# Patient Record
Sex: Female | Born: 1945 | Hispanic: No | Smoking: Former smoker
Health system: Southern US, Community
[De-identification: ages and names within clinical notes are randomized; demographics above are authoritative.]

## PROBLEM LIST (undated history)

## (undated) DIAGNOSIS — K2270 Barrett's esophagus without dysplasia: Secondary | ICD-10-CM

## (undated) DIAGNOSIS — Z973 Presence of spectacles and contact lenses: Secondary | ICD-10-CM

## (undated) DIAGNOSIS — M797 Fibromyalgia: Secondary | ICD-10-CM

## (undated) DIAGNOSIS — Z8489 Family history of other specified conditions: Secondary | ICD-10-CM

## (undated) DIAGNOSIS — E119 Type 2 diabetes mellitus without complications: Secondary | ICD-10-CM

## (undated) DIAGNOSIS — R160 Hepatomegaly, not elsewhere classified: Secondary | ICD-10-CM

## (undated) DIAGNOSIS — I10 Essential (primary) hypertension: Secondary | ICD-10-CM

## (undated) DIAGNOSIS — R42 Dizziness and giddiness: Secondary | ICD-10-CM

## (undated) DIAGNOSIS — K589 Irritable bowel syndrome without diarrhea: Secondary | ICD-10-CM

## (undated) DIAGNOSIS — E7800 Pure hypercholesterolemia, unspecified: Secondary | ICD-10-CM

## (undated) DIAGNOSIS — K219 Gastro-esophageal reflux disease without esophagitis: Secondary | ICD-10-CM

## (undated) HISTORY — DX: Dizziness and giddiness: R42

## (undated) HISTORY — DX: Type 2 diabetes mellitus without complications: E11.9

## (undated) HISTORY — DX: Pure hypercholesterolemia, unspecified: E78.00

## (undated) HISTORY — DX: Fibromyalgia: M79.7

## (undated) HISTORY — DX: Essential (primary) hypertension: I10

## (undated) HISTORY — DX: Irritable bowel syndrome, unspecified: K58.9

---

## 2002-07-08 ENCOUNTER — Emergency Department (HOSPITAL_COMMUNITY): Admission: EM | Admit: 2002-07-08 | Discharge: 2002-07-08 | Payer: Self-pay | Admitting: Emergency Medicine

## 2002-07-08 ENCOUNTER — Encounter: Payer: Self-pay | Admitting: *Deleted

## 2002-07-09 ENCOUNTER — Encounter: Payer: Self-pay | Admitting: *Deleted

## 2002-07-28 ENCOUNTER — Encounter: Admission: RE | Admit: 2002-07-28 | Discharge: 2002-07-28 | Payer: Self-pay | Admitting: Family Medicine

## 2002-08-03 ENCOUNTER — Ambulatory Visit (HOSPITAL_COMMUNITY): Admission: RE | Admit: 2002-08-03 | Discharge: 2002-08-03 | Payer: Self-pay | Admitting: Obstetrics and Gynecology

## 2002-09-08 ENCOUNTER — Encounter: Admission: RE | Admit: 2002-09-08 | Discharge: 2002-09-08 | Payer: Self-pay | Admitting: Family Medicine

## 2003-11-19 ENCOUNTER — Emergency Department (HOSPITAL_COMMUNITY): Admission: EM | Admit: 2003-11-19 | Discharge: 2003-11-19 | Payer: Self-pay | Admitting: *Deleted

## 2011-01-29 DIAGNOSIS — E785 Hyperlipidemia, unspecified: Secondary | ICD-10-CM | POA: Insufficient documentation

## 2011-01-29 DIAGNOSIS — E1159 Type 2 diabetes mellitus with other circulatory complications: Secondary | ICD-10-CM | POA: Insufficient documentation

## 2011-01-29 DIAGNOSIS — E119 Type 2 diabetes mellitus without complications: Secondary | ICD-10-CM | POA: Insufficient documentation

## 2011-01-29 DIAGNOSIS — E1169 Type 2 diabetes mellitus with other specified complication: Secondary | ICD-10-CM | POA: Insufficient documentation

## 2011-03-17 HISTORY — PX: TOTAL VAGINAL HYSTERECTOMY: SHX2548

## 2011-05-31 DIAGNOSIS — M6208 Separation of muscle (nontraumatic), other site: Secondary | ICD-10-CM | POA: Insufficient documentation

## 2011-05-31 DIAGNOSIS — Z90710 Acquired absence of both cervix and uterus: Secondary | ICD-10-CM | POA: Insufficient documentation

## 2012-04-01 DIAGNOSIS — K219 Gastro-esophageal reflux disease without esophagitis: Secondary | ICD-10-CM | POA: Insufficient documentation

## 2012-10-07 DIAGNOSIS — E739 Lactose intolerance, unspecified: Secondary | ICD-10-CM | POA: Insufficient documentation

## 2012-12-03 DIAGNOSIS — B9689 Other specified bacterial agents as the cause of diseases classified elsewhere: Secondary | ICD-10-CM | POA: Insufficient documentation

## 2017-06-04 DIAGNOSIS — K580 Irritable bowel syndrome with diarrhea: Secondary | ICD-10-CM | POA: Insufficient documentation

## 2017-06-10 ENCOUNTER — Other Ambulatory Visit: Payer: Self-pay | Admitting: Internal Medicine

## 2017-06-10 DIAGNOSIS — Z1231 Encounter for screening mammogram for malignant neoplasm of breast: Secondary | ICD-10-CM

## 2017-07-03 NOTE — Progress Notes (Deleted)
[place] at [facility] [address], [place], [place] [address] [phone]  Date:  07/08/2017   Name:  Sue Ruiz   DOB:  07/08/1945   MRN:  [id number]  PCP:  No primary care provider on file.    Chief Complaint: No chief complaint on file.   History of Present Illness:  REDITH DRACH is a 72 y.o. very pleasant female patient who presents with the following:  Here today as a new patient- I take care of her husband Lynann Bologna  There are no active problems to display for this patient.   No past medical history on file.  *** The histories are not reviewed yet. Please review them in the "History" navigator section and refresh this [place].  Social History   Tobacco Use  . Smoking status: Not on file  Substance Use Topics  . Alcohol use: Not on file  . Drug use: Not on file    No family history on file.  Allergies not on file  Medication list has been reviewed and updated.  No current outpatient medications on file prior to visit.   No current facility-administered medications on file prior to visit.     Review of Systems:  As per HPI- otherwise negative.   Physical Examination: There were no vitals filed for this visit. There were no vitals filed for this visit. There is no height or weight on file to calculate BMI. Ideal Body Weight:    GEN: WDWN, NAD, Non-toxic, A & O x 3 HEENT: Atraumatic, Normocephalic. Neck supple. No masses, No LAD. Ears and Nose: No external deformity. CV: RRR, No M/G/R. No JVD. No thrill. No extra heart sounds. PULM: CTA B, no wheezes, crackles, rhonchi. No retractions. No resp. distress. No accessory muscle use. ABD: S, NT, ND, +BS. No rebound. No HSM. EXTR: No c/c/e NEURO Normal gait.  PSYCH: Normally interactive. Conversant. Not depressed or anxious appearing.  Calm demeanor.    Assessment and Plan: ***  Signed Lamar Blinks, MD

## 2017-07-08 ENCOUNTER — Ambulatory Visit: Payer: Medicaid Other | Admitting: Family Medicine

## 2017-10-14 ENCOUNTER — Ambulatory Visit: Payer: Self-pay | Admitting: Family Medicine

## 2017-11-18 ENCOUNTER — Other Ambulatory Visit: Payer: Self-pay | Admitting: Surgery

## 2017-11-18 DIAGNOSIS — R109 Unspecified abdominal pain: Secondary | ICD-10-CM

## 2017-11-30 ENCOUNTER — Other Ambulatory Visit: Payer: Medicaid Other

## 2017-12-07 ENCOUNTER — Ambulatory Visit
Admission: RE | Admit: 2017-12-07 | Discharge: 2017-12-07 | Disposition: A | Discharge status: Home / Self Care | Payer: Medicare Other | Source: Ambulatory Visit | Attending: Surgery | Admitting: Surgery

## 2017-12-07 DIAGNOSIS — R109 Unspecified abdominal pain: Secondary | ICD-10-CM

## 2017-12-07 MED ORDER — IOPAMIDOL (ISOVUE-300) INJECTION 61%
100.0000 mL | Freq: Once | INTRAVENOUS | Status: AC | PRN
  Administered 2017-12-07: 100 mL via INTRAVENOUS

## 2017-12-14 ENCOUNTER — Telehealth: Payer: Self-pay

## 2017-12-14 NOTE — Telephone Encounter (Signed)
Incoming call from pt - she would like to schedule her appt on Thursday to a little later due to stomach issues in the mornings.  Appt changed to 1:30 pm. Arrival 1 pm on Oct 3rd- voiced understanding and directions given also.

## 2017-12-16 ENCOUNTER — Telehealth: Payer: Self-pay

## 2017-12-16 ENCOUNTER — Ambulatory Visit: Payer: Medicare Other | Admitting: Gynecologic Oncology

## 2017-12-16 NOTE — Telephone Encounter (Signed)
Pt called to reschedule due to transportation issues- rescheduled to Monday Oct 7th at 2 pm- arrival 1:30 pm - directions given- Joylene John NP aware of reschedule. No other needs per pt at this time.

## 2017-12-16 NOTE — Progress Notes (Signed)
[place] at [facility] Note: New Patient FIRST VISIT   Consult was requested by Dr. Lucillie Garfinkel for a pelvic mass (h/o TVH/BSO)   Chief Complaint  Patient presents with  . Ovarian mass    GYN Oncologic Summary 1. TBD o .  HPI: Ms. JALAILA CARADONNA  is a very nice 72 y.o.  P4  She noted some right pelvic pain. Sometimes this radiates to the left. She reported this to a provider in [place] who imaged her and told her she had a mass where her left ovary would normally sit.  The issue is she had both her ovaries removed when she had her vaginal hysterectomy in 2013. The pathology and operative reports confirm removal of the bilateral ovaries. There is notation of a fundal fibroid but that, too, appears to have been removed with the hysterectomy. The ovaries seem to be complete on the pathology description.   On 11/03/17 she saw Dr. Royston Sinner with Ob/Gyn and a pelvic US confirmed the presence of a solid left adnexal mass (5.2 x 3.7 x 5.0cm). Given there should be no Gyn organs remaining she was referred to General Surgery (also for hernia) and to [place] for further recommendations/management.   General Surgery (Dr. Brantley Stage) feels she has diastasis recti not requiring surgical repair and ordered a CTAP. The scan confirmed a LEFT adnexal mass 5.0 x 4.2 x 4.1 cm.  Last colonoscopy >10 years ago. Has lower pelvic pain worse when she bends over. Occurs ~2x / week and worsened with BM. She gets nausea, but no emesis. No changes in weight, but has IBS so difficult to interpret GI changes.  Imported EPIC Oncologic History:   No history exists.    Measurement of disease: TBD . Marland Kitchen  Radiology: Ct Abdomen Pelvis W Contrast  Result Date: 12/07/2017 CLINICAL DATA:  Abdominal pain and bloating for 8 months, history irritable bowel syndrome, hypertension, diabetes mellitus, smoker EXAM: CT ABDOMEN AND PELVIS WITH CONTRAST TECHNIQUE: Multidetector CT imaging of  the abdomen and pelvis was performed using the standard protocol following bolus administration of intravenous contrast. Sagittal and coronal MPR images reconstructed from axial data set. Creatinine was obtained on site at [place] at [address] Results: Creatinine 0.6 mg/dL. CONTRAST:  151mL ISOVUE-300 IOPAMIDOL (ISOVUE-300) INJECTION 61% IV. Dilute oral contrast. COMPARISON:  None FINDINGS: Lower chest: Lung bases clear.  Minimal pericardial fluid. Hepatobiliary: Gallbladder and liver normal appearance Pancreas: Normal appearance Spleen: Normal appearance.  Tiny splenule near splenic hilum Adrenals/Urinary Tract: Adrenal glands kidneys, and ureters normal appearance. Low descent of urinary bladder in pelvis consistent with cystocele. Stomach/Bowel: Normal appendix. Small hiatal hernia. Stomach and bowel loops otherwise normal appearance. Vascular/Lymphatic: Atherosclerotic calcifications aorta and iliac arteries without aneurysm. No adenopathy Reproductive: Uterus surgically absent. Nonvisualization of RIGHT ovary. LEFT adnexal mass 5.0 x 4.2 x 4.1 cm, question mass/neoplasm. Other: No free air or free fluid. No hernia or acute inflammatory process. Musculoskeletal: Osseous demineralization. Degenerative disc disease changes L5-S1. IMPRESSION: LEFT ovarian mass 5.0 cm diameter concerning for ovarian neoplasm; characterization by pelvic and transvaginal ultrasound recommended for further assessment. Cystocele. Minimal pericardial effusion. These results will be called to the ordering clinician or representative by the Radiologist Assistant, and communication documented in the PACS or zVision Dashboard. Electronically Signed   By: Lavonia Dana M.D.   On: 12/07/2017 19:15   .   Outpatient Encounter Medications as of 12/20/2017  Medication Sig  . Blood Glucose  Monitoring Suppl (GLUCOCOM BLOOD GLUCOSE MONITOR) DEVI 1 Units by Misc.(Non-Drug; Combo Route) route 4 times daily.  Marland Kitchen esomeprazole  (NEXIUM) 40 MG capsule 2 (two) times daily before a meal.   . glimepiride (AMARYL) 4 MG tablet TAKE 1 TABLET BY MOUTH DAILY  . glucose blood (RELION PRIME TEST) test strip Check blood sugar once in the morning before eating; two hours after dinner; and as needed for low blood sugar symptoms.  . hydrochlorothiazide (HYDRODIURIL) 25 MG tablet TAKE 1 TABLET BY MOUTH DAILY  . metFORMIN (GLUCOPHAGE) 1000 MG tablet TAKE 1 TABLET BY MOUTH TWICE DAILY  . pioglitazone (ACTOS) 15 MG tablet Take by mouth daily.   . pravastatin (PRAVACHOL) 40 MG tablet TAKE 1 TABLET BY MOUTH DAILY  . triamcinolone ointment (KENALOG) 0.1 % APP THIN LAYER EXT AA BID   No facility-administered encounter medications on file as of 12/20/2017.    Allergies  Allergen Reactions  . Codeine Swelling    Tongue gets swollen and a rash  . Asa [Aspirin] Rash    Past Medical History:  Diagnosis Date  . Diabetes mellitus without complication ([place])   . Fibromyalgia   . High cholesterol   . Hypertension   . IBS (irritable bowel syndrome)   . Vertigo    Past Surgical History:  Procedure Laterality Date  . VAGINAL HYSTERECTOMY          Past Gynecological History:   GYNECOLOGIC HISTORY:  . No LMP recorded. age 50 . Menarche: 72 years old . P 4 . Contraceptive none . HRT none  . Last Pap NA Family Hx:  Family History  Problem Relation Age of Onset  . Diabetes Mother   . Hypertension Mother   . Diabetes Father   . Hypertension Father   . Diabetes Brother   . Hypertension Brother    Social Hx:  Marland Kitchen Tobacco use: quit 20 years ago . Alcohol use: prior abuse; none for 20 years . Illicit Drug use: none . Illicit IV Drug use: none    Review of Systems: Review of Systems  Constitutional: Positive for appetite change.  HENT:   Positive for tinnitus.   Gastrointestinal: Positive for abdominal distention, abdominal pain, diarrhea and nausea.  Musculoskeletal: Positive for back pain.  Skin: Positive for itching.  All  other systems reviewed and are negative.   Vitals:  Vitals:   12/20/17 1418  Weight: 143 lb 8 oz (65.1 kg)  Height: 5' (1.524 m)    Vitals:   12/20/17 1418  BP: 122/74  Pulse: 89  Resp: 18  Temp: 98.9 F (37.2 C)  SpO2: 98%   Body mass index is 28.03 kg/m.  Physical Exam: General :  Well developed, 72 y.o., female in no apparent distress HEENT:  Normocephalic/atraumatic, symmetric, EOMI, eyelids normal Neck:   Supple, no masses.  Lymphatics:  No cervical/ submandibular/ supraclavicular/ infraclavicular/ inguinal adenopathy Respiratory:  Respirations unlabored, no use of accessory muscles CV:   Deferred Breast:  Deferred Musculoskeletal: No CVA tenderness, normal muscle strength. Abdomen:  +diastasis recti. Soft, non-tender and nondistended. No masses. Extremities:  No lymphedema, no erythema, non-tender. Skin:   Normal inspection Neuro/Psych:  No focal motor deficit, no abnormal mental status. Normal gait. Normal affect. Alert and oriented to person, place, and time  Genito Urinary: Vulva: Normal external female genitalia.  Bladder/urethra: Urethral meatus normal in size and location. No lesions or   masses, well supported bladder Speculum exam: Vagina: No lesion, no discharge, no bleeding. Cervix: surgically absent Bimanual  exam: Cervix/Uterus/Adnexa: Surgically absent  Adnexa: No masses. Rectovaginal:  Good tone, no masses, no cul de sac nodularity, no parametrial involvement or nodularity. Despite imaging I am unable to detect a pelvic mass.   Assessment  Pelvic mass (left) with h/o TVH/BSO ECOG PERFORMANCE STATUS: 1 - Symptomatic but completely ambulatory  Plan  1. Data reviewed ? I independently reviewed the images and the radiology reports and discussed my interpretation in the presence of the patient today ? The mass is pelvic in location, it appears c/w a Gyn source but it is unusual in the sense that she already has had her uterus and ovary removed. Defer  to surgery if they can think of other etiology (?node/mesenteric lesion)? ? Possible ovarian remnant? ? I reviewed her referring doctor's office notes and I have summarized in the HPI ? History was obtained from the patient and the chart ? We reviewed no tumor marker has been recorded that I can see ? I will order CEA and CA125 2. Etiology ? Could be ovarian remnant versus Non-GYN origin ? Fibroid possible - she did have fibroid at time of surgery and I do not know what preop imaging showed.  ? Agree with Dr. Royston Sinner in [place] could be parasitic fibroid although uncommon. ? We will order an MRI to better assess the mass and potentially the origin 3. Surgical discussion ? The mass is intimate on a couple of the images with the bladder; again, we'll see what the MRI finds. ? She may need Dr. Brantley Stage and myself to be involved in surgical planning. 4. I will have her return after her MRI  Isabel Caprice, MD  12/20/2017, 5:02 PM    Cc: Lucillie Garfinkel, MD (Referring Ob/Gyn) Jenel Lucks, PA-C (PCP) Erroll Luna, MD (General Surgery)

## 2017-12-20 ENCOUNTER — Inpatient Hospital Stay: Payer: Medicare Other

## 2017-12-20 ENCOUNTER — Inpatient Hospital Stay: Payer: Medicare Other | Attending: Obstetrics | Admitting: Obstetrics

## 2017-12-20 ENCOUNTER — Encounter: Payer: Self-pay | Admitting: Obstetrics

## 2017-12-20 VITALS — BP 122/74 | HR 89 | Temp 98.9°F | Resp 18 | Ht 60.0 in | Wt 143.5 lb

## 2017-12-20 DIAGNOSIS — D398 Neoplasm of uncertain behavior of other specified female genital organs: Secondary | ICD-10-CM | POA: Insufficient documentation

## 2017-12-20 DIAGNOSIS — N949 Unspecified condition associated with female genital organs and menstrual cycle: Secondary | ICD-10-CM

## 2017-12-20 DIAGNOSIS — R978 Other abnormal tumor markers: Secondary | ICD-10-CM

## 2017-12-20 DIAGNOSIS — Z90722 Acquired absence of ovaries, bilateral: Secondary | ICD-10-CM | POA: Diagnosis not present

## 2017-12-20 DIAGNOSIS — D3910 Neoplasm of uncertain behavior of unspecified ovary: Secondary | ICD-10-CM | POA: Diagnosis not present

## 2017-12-20 DIAGNOSIS — Z90710 Acquired absence of both cervix and uterus: Secondary | ICD-10-CM | POA: Diagnosis not present

## 2017-12-20 DIAGNOSIS — R1900 Intra-abdominal and pelvic swelling, mass and lump, unspecified site: Secondary | ICD-10-CM

## 2017-12-20 DIAGNOSIS — N838 Other noninflammatory disorders of ovary, fallopian tube and broad ligament: Secondary | ICD-10-CM

## 2017-12-20 NOTE — Patient Instructions (Signed)
1. We will order an MRI to better assess the mass  2. Return after your MRI 3. We will check some bloodwork today and discuss those results when you return. 4. I would prefer you to have a colonoscopy if possible.

## 2017-12-21 LAB — CEA (IN HOUSE-CHCC): CEA (CHCC-In House): 1.18 ng/mL (ref 0.00–5.00)

## 2017-12-21 LAB — CA 125: Cancer Antigen (CA) 125: 4 U/mL (ref 0.0–38.1)

## 2017-12-23 NOTE — Progress Notes (Signed)
[place] at [facility] Note: New Patient FIRST VISIT   Consult was requested by Dr. Lucillie Garfinkel for a pelvic mass (h/o TVH/BSO)   Chief Complaint  Patient presents with  . Ovarian mass    GYN Oncologic Summary 1. TBD o .  HPI: Ms. Sue Ruiz  is a very nice 72 y.o.  P4  She noted some right pelvic pain. Sometimes this radiates to the left. She reported this to a provider in [place] who imaged her early 2019 and told her she had a mass where her left ovary would normally sit.  The issue is she had both her ovaries removed when she had her vaginal hysterectomy in 2013. The pathology and operative reports confirm removal of the bilateral ovaries. There is notation of a fundal fibroid but that, too, appears to have been removed with the hysterectomy. The ovaries seem to be complete on the pathology description.   On 11/03/17 she saw Dr. Royston Sinner with Ob/Gyn and a pelvic US confirmed the presence of a solid left adnexal mass (5.2 x 3.7 x 5.0cm). Given there should be no Gyn organs remaining she was referred to General Surgery (also for hernia) and then to [place] for further recommendations/management.   General Surgery (Dr. Brantley Stage) feels she has diastasis recti not requiring surgical repair and ordered a CTAP. The scan confirmed a LEFT adnexal mass 5.0 x 4.2 x 4.1 cm.  Last colonoscopy >10 years ago. Has lower pelvic pain worse when she bends over. Occurs ~2x / week and worsened with BM. She gets nausea, but no emesis. No changes in weight, but has IBS so difficult to interpret GI changes.  Since her last visit I ordered an MRI as noted below. In addition tumor markers, which were normal. MRI: 5 cm left adnexal mass, with nonspecific characteristics. Differential diagnosis includes a broad ligament fibroid, leiomyosarcoma, and other neurogenic or mesenchymal tumors. Surgical evaluation should be considered. No evidence of pelvic metastatic disease  or other acute findings.  Recall the ultrasound with Dr. Royston Sinner revealed no doppler flow in the mass.  January 2019 CTAP was done in [place], not available, but an ultrasound in followup showed the mass was stable. ~5x4cm.  She states today she is having no issues.  Imported EPIC Oncologic History:   No history exists.    Measurement of disease: Recent Labs    12/20/17 1521  CAN125 4.0  . CEA = 1.18  Radiology: Mr Pelvis W Wo Contrast  Result Date: 12/27/2017 CLINICAL DATA:  Abdominal pain and bloating for several months. Pelvic mass on recent CT. Previous TAH-BSO. Creatinine was obtained on site at [facility] at [address] Results: Creatinine 0.7 mg/dL. EXAM: MRI PELVIS WITHOUT AND WITH CONTRAST TECHNIQUE: Multiplanar multisequence MR imaging of the pelvis was performed both before and after administration of intravenous contrast. CONTRAST:  26mL MULTIHANCE GADOBENATE DIMEGLUMINE 529 MG/ML IV SOLN COMPARISON:  CT on 12/07/2017 FINDINGS: Urinary Tract: No urinary bladder or urethral abnormality. Bowel: Unremarkable pelvic bowel loops. Vascular/Lymphatic: Unremarkable. No pathologically enlarged pelvic lymph nodes identified. Reproductive: -- Uterus: Surgically absent. Vaginal cuff is unremarkable in appearance. -- Right ovary: History of surgical removal. No right adnexal mass identified. -- Left ovary: History of surgical removal. A well-circumscribed mass is seen in the left adnexa which shows heterogeneous pattern of T2 hypointensity and hyperintensity, and postcontrast enhancement. This mass measures 5.0 x 4.0 cm on image 18/30. There is no evidence of invasion  of adjacent structures. This has nonspecific characteristics, and differential diagnosis includes a broad ligament fibroid, and neurogenic or other mesenchymal tumors. Other: No peritoneal thickening or abnormal free fluid. Musculoskeletal:  Unremarkable. IMPRESSION: 5 cm left adnexal mass, with nonspecific  characteristics. Differential diagnosis includes a broad ligament fibroid, leiomyosarcoma, and other neurogenic or mesenchymal tumors. Surgical evaluation should be considered. No evidence of pelvic metastatic disease or other acute findings. Prior hysterectomy. Electronically Signed   By: Earle Gell M.D.   On: 12/27/2017 11:13   Ct Abdomen Pelvis W Contrast  Result Date: 12/07/2017 CLINICAL DATA:  Abdominal pain and bloating for 8 months, history irritable bowel syndrome, hypertension, diabetes mellitus, smoker EXAM: CT ABDOMEN AND PELVIS WITH CONTRAST TECHNIQUE: Multidetector CT imaging of the abdomen and pelvis was performed using the standard protocol following bolus administration of intravenous contrast. Sagittal and coronal MPR images reconstructed from axial data set. Creatinine was obtained on site at [place] at [address] Results: Creatinine 0.6 mg/dL. CONTRAST:  146mL ISOVUE-300 IOPAMIDOL (ISOVUE-300) INJECTION 61% IV. Dilute oral contrast. COMPARISON:  None FINDINGS: Lower chest: Lung bases clear.  Minimal pericardial fluid. Hepatobiliary: Gallbladder and liver normal appearance Pancreas: Normal appearance Spleen: Normal appearance.  Tiny splenule near splenic hilum Adrenals/Urinary Tract: Adrenal glands kidneys, and ureters normal appearance. Low descent of urinary bladder in pelvis consistent with cystocele. Stomach/Bowel: Normal appendix. Small hiatal hernia. Stomach and bowel loops otherwise normal appearance. Vascular/Lymphatic: Atherosclerotic calcifications aorta and iliac arteries without aneurysm. No adenopathy Reproductive: Uterus surgically absent. Nonvisualization of RIGHT ovary. LEFT adnexal mass 5.0 x 4.2 x 4.1 cm, question mass/neoplasm. Other: No free air or free fluid. No hernia or acute inflammatory process. Musculoskeletal: Osseous demineralization. Degenerative disc disease changes L5-S1. IMPRESSION: LEFT ovarian mass 5.0 cm diameter concerning for ovarian  neoplasm; characterization by pelvic and transvaginal ultrasound recommended for further assessment. Cystocele. Minimal pericardial effusion. These results will be called to the ordering clinician or representative by the Radiologist Assistant, and communication documented in the PACS or zVision Dashboard. Electronically Signed   By: Lavonia Dana M.D.   On: 12/07/2017 19:15   .   Outpatient Encounter Medications as of 12/28/2017  Medication Sig  . acetaminophen (TYLENOL) 325 MG tablet Take 650 mg by mouth as needed.  . Blood Glucose Monitoring Suppl (GLUCOCOM BLOOD GLUCOSE MONITOR) DEVI 1 Units by Misc.(Non-Drug; Combo Route) route 4 times daily.  Marland Kitchen esomeprazole (NEXIUM) 40 MG capsule 2 (two) times daily before a meal.   . glimepiride (AMARYL) 4 MG tablet TAKE 1 TABLET BY MOUTH DAILY  . glucose blood (RELION PRIME TEST) test strip Check blood sugar once in the morning before eating; two hours after dinner; and as needed for low blood sugar symptoms.  . hydrochlorothiazide (HYDRODIURIL) 25 MG tablet TAKE 1 TABLET BY MOUTH DAILY  . metFORMIN (GLUCOPHAGE) 1000 MG tablet TAKE 1 TABLET BY MOUTH TWICE DAILY  . pioglitazone (ACTOS) 15 MG tablet Take by mouth daily.   . pravastatin (PRAVACHOL) 40 MG tablet TAKE 1 TABLET BY MOUTH DAILY  . triamcinolone ointment (KENALOG) 0.1 % APP THIN LAYER EXT AA BID   No facility-administered encounter medications on file as of 12/28/2017.    Allergies  Allergen Reactions  . Codeine Swelling    Tongue gets swollen and a rash  . Asa [Aspirin] Rash    Past Medical History:  Diagnosis Date  . Diabetes mellitus without complication ([place])   . Fibromyalgia   . High cholesterol   . Hypertension   .  IBS (irritable bowel syndrome)   . Vertigo    Past Surgical History:  Procedure Laterality Date  . TOTAL VAGINAL HYSTERECTOMY  2013   with BSO - confirmed by op report and pathology. Done for prolapse        Past Gynecological History:   GYNECOLOGIC HISTORY:   . No LMP recorded. age 88 . Menarche: 72 years old . P 4 . Contraceptive none . HRT none  . Last Pap NA Family Hx:  Family History  Problem Relation Age of Onset  . Diabetes Mother   . Hypertension Mother   . Diabetes Father   . Hypertension Father   . Diabetes Brother   . Hypertension Brother    Social Hx:  Marland Kitchen Tobacco use: quit 20 years ago . Alcohol use: prior abuse; none for 20 years . Illicit Drug use: none . Illicit IV Drug use: none    Review of Systems: Review of Systems  HENT:   Positive for tinnitus.   Gastrointestinal: Positive for abdominal pain and diarrhea.  Musculoskeletal: Positive for back pain.  All other systems reviewed and are negative. Notes abdominal pain is RUQ  Vitals:  Vitals:   12/28/17 1324  Weight: 144 lb 6.4 oz (65.5 kg)  Height: 5\' 1"  (1.549 m)    Vitals:   12/28/17 1324  BP: (!) 120/57  Pulse: 93  Resp: 20  Temp: 99 F (37.2 C)  SpO2: 98%   Body mass index is 27.28 kg/m.  Physical Exam:  General :  Well developed, 72 y.o., female in no apparent distress HEENT:  Normocephalic/atraumatic, symmetric, EOMI, eyelids normal Neck:   No visible masses.  Respiratory:  Respirations unlabored, no use of accessory muscles CV:   Deferred Breast:  Deferred Musculoskeletal: Normal muscle strength. Abdomen:  No visible masses or protrusion Extremities:  No visible edema or deformities Skin:   Normal inspection Neuro/Psych:  No focal motor deficit, no abnormal mental status. Normal gait. Normal affect. Alert and oriented to person, place, and time  Genito Urinary: from 12/20/17 visit Vulva: Normal external female genitalia.  Bladder/urethra: Urethral meatus normal in size and location. No lesions or   masses, well supported bladder Speculum exam: Vagina: No lesion, no discharge, no bleeding. Cervix: surgically absent Bimanual exam: Cervix/Uterus/Adnexa: Surgically absent  Adnexa: No masses. Rectovaginal:  Good tone, no masses, no cul de  sac nodularity, no parametrial involvement or nodularity. Despite imaging I am unable to detect a pelvic mass.   Assessment  Pelvic mass (left) with h/o TVH/BSO  Plan  1. Data reviewed ? I independently reviewed the MRI images with the patient in the room today. ? Reassuring is the lack of invasion into any vital organs. ? There is no reassurance as to the etiology. ? Her CEA and CA125 were normal. 2. Etiology ? Could be ovarian remnant versus Non-GYN origin ? Fibroid possible - she did have fibroid at time of surgery and I do not know what preop imaging showed.  3. Reassuring is the presumed stability since Jan 2019  ? We will request the disc (CTScan) from [place] in Jan 2019 for radiology to issue comparison addendum to MRI. ? Assuming stability repeat imaging versus surgery an option. ? She wishes to proceed with observation ? RTC 6 months with imaging prior  Face to face time with patient was 25 minutes. Over 50% of this time was spent on counseling and coordination of care.   Isabel Caprice, MD  12/28/2017, 2:26 PM  Cc: Lucillie Garfinkel, MD (Referring Ob/Gyn) Jenel Lucks, PA-C (PCP)

## 2017-12-26 ENCOUNTER — Ambulatory Visit
Admission: RE | Admit: 2017-12-26 | Discharge: 2017-12-26 | Disposition: A | Discharge status: Home / Self Care | Payer: Medicare Other | Source: Ambulatory Visit | Attending: Obstetrics | Admitting: Obstetrics

## 2017-12-26 DIAGNOSIS — N949 Unspecified condition associated with female genital organs and menstrual cycle: Secondary | ICD-10-CM

## 2017-12-26 MED ORDER — GADOBENATE DIMEGLUMINE 529 MG/ML IV SOLN
13.0000 mL | Freq: Once | INTRAVENOUS | Status: AC | PRN
  Administered 2017-12-26: 13 mL via INTRAVENOUS

## 2017-12-28 ENCOUNTER — Inpatient Hospital Stay (HOSPITAL_BASED_OUTPATIENT_CLINIC_OR_DEPARTMENT_OTHER): Payer: Medicare Other | Admitting: Obstetrics

## 2017-12-28 ENCOUNTER — Encounter: Payer: Self-pay | Admitting: Obstetrics

## 2017-12-28 VITALS — BP 120/57 | HR 93 | Temp 99.0°F | Resp 20 | Ht 61.0 in | Wt 144.4 lb

## 2017-12-28 DIAGNOSIS — Z90722 Acquired absence of ovaries, bilateral: Secondary | ICD-10-CM

## 2017-12-28 DIAGNOSIS — R1907 Generalized intra-abdominal and pelvic swelling, mass and lump: Secondary | ICD-10-CM

## 2017-12-28 DIAGNOSIS — D398 Neoplasm of uncertain behavior of other specified female genital organs: Secondary | ICD-10-CM | POA: Diagnosis not present

## 2017-12-28 DIAGNOSIS — Z90710 Acquired absence of both cervix and uterus: Secondary | ICD-10-CM

## 2017-12-28 DIAGNOSIS — N838 Other noninflammatory disorders of ovary, fallopian tube and broad ligament: Secondary | ICD-10-CM

## 2017-12-28 NOTE — Patient Instructions (Addendum)
1. Colonoscopy recommended 2. Return in 6 month with CTscan prior 3. We will obtain your CTScan from Jan 2019 ([place]) to compare to the recent scans done here.

## 2017-12-29 ENCOUNTER — Telehealth: Payer: Self-pay | Admitting: *Deleted

## 2017-12-29 NOTE — Telephone Encounter (Signed)
Faxed medical release to [facility] for the CD/DVD of the CT A/P.

## 2017-12-30 ENCOUNTER — Telehealth: Payer: Self-pay

## 2017-12-30 ENCOUNTER — Telehealth: Payer: Self-pay | Admitting: *Deleted

## 2017-12-30 NOTE — Telephone Encounter (Signed)
Called and spoke with Sonia Baller at [facility], patient did not have CT done at that faculty. They have no access to the scan. MD notified.

## 2017-12-30 NOTE — Telephone Encounter (Signed)
Received call from patient regarding she spoke with an office "[facility]", said they would not see her if she was also been seen by Dr Gerarda Fraction.  First said she thought it was GI doctor but then said GYN office.  Asked Melissa NP about to get a better understanding and called pt back to clarify where the office was or doctor's name.  When I called pt back, she said she thinks she has figured it out now. Reminded her of upcoming appt in April for CT and to call our office in March 2020 to schedule appt here for f/u and discussion of CT results. Let her know she can call her before for any worsening symptoms. Pt voiced understanding.  Pt said she has seen [facility] in past and will call them to make appt for colonoscopy but she is having transportation issues. She verbalized she has no friends here and children live further away or are unable to take her.  Discussed possible resources including [employer] may have aide/transportation assistance list.  Pt reports she has their number. Also our spanish interpreter Almyra Free was called and said ok to provide her number as they have a support group in the community that helps with transportation.  Pt said she would try these resources and call [facility] for appt. No other needs per pt at this time.

## 2017-12-31 ENCOUNTER — Inpatient Hospital Stay
Admission: RE | Admit: 2017-12-31 | Discharge: 2017-12-31 | Disposition: A | Discharge status: Home / Self Care | Payer: Self-pay | Source: Ambulatory Visit | Attending: Obstetrics | Admitting: Obstetrics

## 2017-12-31 ENCOUNTER — Other Ambulatory Visit: Payer: Self-pay | Admitting: Obstetrics

## 2017-12-31 DIAGNOSIS — C801 Malignant (primary) neoplasm, unspecified: Secondary | ICD-10-CM

## 2018-01-05 ENCOUNTER — Telehealth: Payer: Self-pay | Admitting: *Deleted

## 2018-01-05 NOTE — Telephone Encounter (Signed)
Called and spoke with Sonia Baller at [facility]. Regarding the patient. Patient was seen there on 10/8 by a physician and is scheduled for a procedure on December 9th.

## 2018-02-23 ENCOUNTER — Emergency Department (HOSPITAL_COMMUNITY)
Admission: EM | Admit: 2018-02-23 | Discharge: 2018-02-23 | Disposition: A | Discharge status: Home / Self Care | Payer: Medicare Other | Attending: Emergency Medicine | Admitting: Emergency Medicine

## 2018-02-23 ENCOUNTER — Emergency Department (HOSPITAL_COMMUNITY): Payer: Medicare Other

## 2018-02-23 ENCOUNTER — Encounter (HOSPITAL_COMMUNITY): Payer: Self-pay

## 2018-02-23 ENCOUNTER — Other Ambulatory Visit: Payer: Self-pay

## 2018-02-23 DIAGNOSIS — R1012 Left upper quadrant pain: Secondary | ICD-10-CM | POA: Diagnosis present

## 2018-02-23 DIAGNOSIS — Z79899 Other long term (current) drug therapy: Secondary | ICD-10-CM | POA: Insufficient documentation

## 2018-02-23 DIAGNOSIS — Z87891 Personal history of nicotine dependence: Secondary | ICD-10-CM | POA: Diagnosis not present

## 2018-02-23 DIAGNOSIS — K5904 Chronic idiopathic constipation: Secondary | ICD-10-CM | POA: Diagnosis not present

## 2018-02-23 DIAGNOSIS — I10 Essential (primary) hypertension: Secondary | ICD-10-CM | POA: Diagnosis not present

## 2018-02-23 DIAGNOSIS — E119 Type 2 diabetes mellitus without complications: Secondary | ICD-10-CM | POA: Diagnosis not present

## 2018-02-23 DIAGNOSIS — Z7984 Long term (current) use of oral hypoglycemic drugs: Secondary | ICD-10-CM | POA: Diagnosis not present

## 2018-02-23 LAB — URINALYSIS, ROUTINE W REFLEX MICROSCOPIC
Bilirubin Urine: NEGATIVE
Glucose, UA: NEGATIVE mg/dL
Ketones, ur: NEGATIVE mg/dL
Nitrite: NEGATIVE
Protein, ur: NEGATIVE mg/dL
SPECIFIC GRAVITY, URINE: 1.008 (ref 1.005–1.030)
pH: 5 (ref 5.0–8.0)

## 2018-02-23 LAB — COMPREHENSIVE METABOLIC PANEL
ALT: 22 U/L (ref 0–44)
AST: 21 U/L (ref 15–41)
Albumin: 3.9 g/dL (ref 3.5–5.0)
Alkaline Phosphatase: 54 U/L (ref 38–126)
Anion gap: 11 (ref 5–15)
BILIRUBIN TOTAL: 0.3 mg/dL (ref 0.3–1.2)
BUN: 10 mg/dL (ref 8–23)
CO2: 27 mmol/L (ref 22–32)
Calcium: 9 mg/dL (ref 8.9–10.3)
Chloride: 100 mmol/L (ref 98–111)
Creatinine, Ser: 0.68 mg/dL (ref 0.44–1.00)
GFR calc non Af Amer: >60 mL/min (ref >60)
GLUCOSE: 232 mg/dL — AB (ref 70–99)
POTASSIUM: 3.8 mmol/L (ref 3.5–5.1)
SODIUM: 138 mmol/L (ref 135–145)
TOTAL PROTEIN: 7.7 g/dL (ref 6.5–8.1)

## 2018-02-23 LAB — CBC
HEMATOCRIT: 35.4 % — AB (ref 36.0–46.0)
HEMOGLOBIN: 10.3 g/dL — AB (ref 12.0–15.0)
MCH: 21.9 pg — ABNORMAL LOW (ref 26.0–34.0)
MCHC: 29.1 g/dL — ABNORMAL LOW (ref 30.0–36.0)
MCV: 75.2 fL — ABNORMAL LOW (ref 80.0–100.0)
NRBC: 0 % (ref 0.0–0.2)
Platelets: 384 10*3/uL (ref 150–400)
RBC: 4.71 MIL/uL (ref 3.87–5.11)
RDW: 18.9 % — AB (ref 11.5–15.5)
WBC: 11 10*3/uL — ABNORMAL HIGH (ref 4.0–10.5)

## 2018-02-23 LAB — LIPASE, BLOOD: LIPASE: 27 U/L (ref 11–51)

## 2018-02-23 MED ORDER — DOCUSATE SODIUM 250 MG PO CAPS: 250.0000 mg | ORAL_CAPSULE | Freq: Every day | ORAL | 0 refills | Status: AC

## 2018-02-23 MED ORDER — FLEET ENEMA 7-19 GM/118ML RE ENEM
1.0000 | ENEMA | Freq: Once | RECTAL | Status: AC
  Administered 2018-02-23: 1 via RECTAL
  Filled 2018-02-23: qty 1

## 2018-02-23 MED ORDER — IOHEXOL 300 MG/ML  SOLN
100.0000 mL | Freq: Once | INTRAMUSCULAR | Status: AC | PRN
  Administered 2018-02-23: 100 mL via INTRAVENOUS

## 2018-02-23 MED ORDER — POLYETHYLENE GLYCOL 3350 17 G PO PACK: 17.0000 g | PACK | Freq: Two times a day (BID) | ORAL | 0 refills | Status: DC

## 2018-02-23 NOTE — ED Triage Notes (Signed)
Pt reports diffuse abd pain. Pt reports abd distension. Distension noted. Pt reports hx of constipation. Pt reports small BM this am. Pt denies n/v

## 2018-02-23 NOTE — ED Notes (Signed)
Per MD Isaacs patient can have something to eat.  I gave her a ham sandwich and a stick of cheese.

## 2018-02-23 NOTE — Discharge Instructions (Addendum)
For your constipation:  Take the MIRALAX twice a day with a full 8 oz drink until stool is soft and liquid, then stop  Take the stool softener daily

## 2018-02-23 NOTE — ED Provider Notes (Signed)
[facility] DEPT Provider Note   CSN: [id number] Arrival date & time: 02/23/18  1450     History   Chief Complaint Chief Complaint  Patient presents with  . Abdominal Pain  . Abdominal Distension    HPI Sue Ruiz is a 72 y.o. female.  HPI   72 year old female with past medical history of hypertension, diabetes, IBS, pelvic mass, here with abdominal pain.  The patient states that for the last week, she has had progressively worsening, sharp, intermittent, left upper quadrant and epigastric pain that radiates towards her right upper quadrant.  She is had decreased bowel movements and progressively worsening abdominal distention as well.  She feels abdominal fullness as well as nausea with eating.  No vomiting.  No fevers or chills.  She has a history of chronic constipation but states this is worse than usual.  She has had some bowel movements, with soft stool, but feels like the volume is less than usual.  She continues to pass gas.  No vaginal bleeding or discharge.  Of note, she has a left ovarian mass and is scheduled to have this removed in April.  No specific alleviating factors.  Pain is worse with palpation and eating.  Past Medical History:  Diagnosis Date  . Diabetes mellitus without complication ([place])   . Fibromyalgia   . High cholesterol   . Hypertension   . IBS (irritable bowel syndrome)   . Vertigo     Patient Active Problem List   Diagnosis Date Noted  . Pelvic mass 12/20/2017    Past Surgical History:  Procedure Laterality Date  . TOTAL VAGINAL HYSTERECTOMY  2013   with BSO - confirmed by op report and pathology. Done for prolapse     OB History   None      Home Medications    Prior to Admission medications   Medication Sig Start Date End Date Taking? Authorizing Provider  acetaminophen (TYLENOL) 325 MG tablet Take 650 mg by mouth as needed.    [provider]  Blood Glucose Monitoring Suppl (GLUCOCOM  BLOOD GLUCOSE MONITOR) DEVI 1 Units by Misc.(Non-Drug; Combo Route) route 4 times daily. 04/11/14   [provider]  esomeprazole (NEXIUM) 40 MG capsule 2 (two) times daily before a meal.  12/15/17   [provider]  glimepiride (AMARYL) 4 MG tablet TAKE 1 TABLET BY MOUTH DAILY 07/13/17   [provider]  glucose blood (RELION PRIME TEST) test strip Check blood sugar once in the morning before eating; two hours after dinner; and as needed for low blood sugar symptoms. 09/15/17   [provider]  hydrochlorothiazide (HYDRODIURIL) 25 MG tablet TAKE 1 TABLET BY MOUTH DAILY 12/15/17   [provider]  metFORMIN (GLUCOPHAGE) 1000 MG tablet TAKE 1 TABLET BY MOUTH TWICE DAILY 07/13/17   [provider]  pioglitazone (ACTOS) 15 MG tablet Take by mouth daily.  09/20/17   [provider]  pravastatin (PRAVACHOL) 40 MG tablet TAKE 1 TABLET BY MOUTH DAILY 12/15/17   [provider]  triamcinolone ointment (KENALOG) 0.1 % APP THIN LAYER EXT AA BID 10/07/17   [provider]    Family History Family History  Problem Relation Age of Onset  . Diabetes Mother   . Hypertension Mother   . Diabetes Father   . Hypertension Father   . Diabetes Brother   . Hypertension Brother     Social History Social History   Tobacco Use  . Smoking status:  Former Smoker    Years: 52.00  . Smokeless tobacco: Never Used  Substance Use Topics  . Alcohol use: Not Currently    Comment: "drank for 26 years, haven't touched a drink in 20 years"  . Drug use: Never     Allergies   Codeine and Asa [aspirin]   Review of Systems Review of Systems  Constitutional: Positive for fatigue. Negative for chills and fever.  HENT: Negative for congestion and rhinorrhea.   Eyes: Negative for visual disturbance.  Respiratory: Negative for cough, shortness of breath and wheezing.   Cardiovascular: Negative for chest pain and leg swelling.  Gastrointestinal:  Positive for abdominal distention, abdominal pain, nausea and vomiting. Negative for diarrhea.  Genitourinary: Negative for dysuria and flank pain.  Musculoskeletal: Negative for neck pain and neck stiffness.  Skin: Negative for rash and wound.  Allergic/Immunologic: Negative for immunocompromised state.  Neurological: Positive for weakness. Negative for syncope and headaches.  All other systems reviewed and are negative.    Physical Exam Updated Vital Signs BP 131/68   Pulse 90   Temp 98.6 F (37 C) (Oral)   Resp 16   Ht 5' (1.524 m)   Wt 64.9 kg   SpO2 99%   BMI 27.93 kg/m   Physical Exam  Constitutional: She is oriented to person, place, and time. She appears well-developed and well-nourished. No distress.  HENT:  Head: Normocephalic and atraumatic.  Eyes: Conjunctivae are normal.  Neck: Neck supple.  Cardiovascular: Normal rate, regular rhythm and normal heart sounds. Exam reveals no friction rub.  No murmur heard. Pulmonary/Chest: Effort normal and breath sounds normal. No respiratory distress. She has no wheezes. She has no rales.  Abdominal: She exhibits no distension. There is generalized tenderness. There is guarding. There is no rigidity and no rebound.  Musculoskeletal: She exhibits no edema.  Neurological: She is alert and oriented to person, place, and time. She exhibits normal muscle tone.  Skin: Skin is warm. Capillary refill takes less than 2 seconds.  Psychiatric: She has a normal mood and affect.  Nursing note and vitals reviewed.    ED Treatments / Results  Labs (all labs ordered are listed, but only abnormal results are displayed) Labs Reviewed  COMPREHENSIVE METABOLIC PANEL - Abnormal; Notable for the following components:      Result Value   Glucose, Bld 232 (*)    All other components within normal limits  CBC - Abnormal; Notable for the following components:   WBC 11.0 (*)    Hemoglobin 10.3 (*)    HCT 35.4 (*)    MCV 75.2 (*)    MCH 21.9  (*)    MCHC 29.1 (*)    RDW 18.9 (*)    All other components within normal limits  URINALYSIS, ROUTINE W REFLEX MICROSCOPIC - Abnormal; Notable for the following components:   Color, Urine STRAW (*)    Hgb urine dipstick SMALL (*)    Leukocytes, UA MODERATE (*)    Bacteria, UA RARE (*)    All other components within normal limits  LIPASE, BLOOD    EKG None  Radiology Ct Abdomen Pelvis W Contrast  Result Date: 02/23/2018 CLINICAL DATA:  Diffuse abdominal pain and distension. Ex-smoker. EXAM: CT ABDOMEN AND PELVIS WITH CONTRAST TECHNIQUE: Multidetector CT imaging of the abdomen and pelvis was performed using the standard protocol following bolus administration of intravenous contrast. CONTRAST:  140mL OMNIPAQUE IOHEXOL 300 MG/ML  SOLN COMPARISON:  Abdomen and pelvis CT dated 12/07/2017 and pelvis MR  dated 12/26/2017. FINDINGS: Lower chest: The lungs are hyperexpanded with diffuse peribronchial thickening. The previously seen minimal pericardial effusion is no longer demonstrated. Hepatobiliary: Diffuse low density of the liver relative to the spleen without significant change. Unremarkable gallbladder. Pancreas: Unremarkable. No pancreatic ductal dilatation or surrounding inflammatory changes. Spleen: Normal in size without focal abnormality. Adrenals/Urinary Tract: Normal appearing adrenal glands. Stable small right renal cyst. Normal appearing left kidney, ureters and urinary bladder. The bladder remains low in position, compatible with a cystocele. Stomach/Bowel: Small hiatal hernia. Appendix appears normal. No evidence of bowel wall thickening, distention, or inflammatory changes. Vascular/Lymphatic: Atheromatous arterial calcifications without aneurysm. No enlarged lymph nodes. Reproductive: Previously demonstrated heterogeneous left adnexal mass. This currently measures 5.1 x 3.9 cm on image number 63 series 2. On 12/07/2017, this measured 5.0 x 4.2 cm in maximum dimensions. Surgically absent  uterus. The right ovary is not visualized. Other: No abdominal wall hernia or abnormality. No abdominopelvic ascites. Musculoskeletal: Lumbar and lower thoracic spine degenerative changes. IMPRESSION: 1. No acute abnormality. 2. No significant change in the previously demonstrated nonspecific left adnexal mass. 3. Stable cystocele. 4. Changes of COPD and chronic bronchitis. 5. Stable diffuse hepatic steatosis. Electronically Signed   By: Claudie Revering M.D.   On: 02/23/2018 19:11    Procedures Procedures (including critical care time)  Medications Ordered in ED Medications  sodium phosphate (FLEET) 7-19 GM/118ML enema 1 enema (has no administration in time range)  iohexol (OMNIPAQUE) 300 MG/ML solution 100 mL (100 mLs Intravenous Contrast Given 02/23/18 1852)     Initial Impression / Assessment and Plan / ED Course  I have reviewed the triage vital signs and the nursing notes.  Pertinent labs & imaging results that were available during my care of the patient were reviewed by me and considered in my medical decision making (see chart for details).    59-year-old female with past medical history as above here with diffuse abdominal pain and distention.  Patient has an extensive history of similar episodes secondary to constipation.  She has not had any fevers and is otherwise well-appearing.  Lab work is very reassuring.  She is a mild, nonspecific leukocytosis but as mentioned no evidence of sepsis or infection.  Renal function LFTs are normal.  Lipase normal.  No signs of UTI.  CT scan obtained shows no acute abnormality.  She does have a pretty significant stool burden on this and history of similar presentations with IBS.  Discussed management options.  Given degree of constipation and subjective discomfort, decision made to administer enema here.  Patient tolerated well.  Will discharge with good bowel regimen and outpatient follow-up.   Final Clinical Impressions(s) / ED Diagnoses   Final  diagnoses:  Chronic idiopathic constipation    ED Discharge Orders    None       Duffy Bruce, MD 02/23/18 [id number]

## 2018-03-16 DIAGNOSIS — D649 Anemia, unspecified: Secondary | ICD-10-CM

## 2018-03-16 HISTORY — DX: Anemia, unspecified: D64.9

## 2018-04-20 DIAGNOSIS — J208 Acute bronchitis due to other specified organisms: Secondary | ICD-10-CM | POA: Insufficient documentation

## 2018-04-20 DIAGNOSIS — B9689 Other specified bacterial agents as the cause of diseases classified elsewhere: Secondary | ICD-10-CM | POA: Insufficient documentation

## 2018-06-21 ENCOUNTER — Other Ambulatory Visit: Payer: Self-pay | Admitting: Gynecologic Oncology

## 2018-06-21 DIAGNOSIS — N838 Other noninflammatory disorders of ovary, fallopian tube and broad ligament: Secondary | ICD-10-CM

## 2018-06-27 ENCOUNTER — Other Ambulatory Visit: Payer: Medicare Other

## 2018-06-29 ENCOUNTER — Telehealth: Payer: Self-pay | Admitting: Oncology

## 2018-06-29 ENCOUNTER — Ambulatory Visit
Admission: RE | Admit: 2018-06-29 | Discharge: 2018-06-29 | Disposition: A | Discharge status: Home / Self Care | Payer: Self-pay | Source: Ambulatory Visit | Attending: Gynecologic Oncology | Admitting: Gynecologic Oncology

## 2018-06-29 ENCOUNTER — Ambulatory Visit (HOSPITAL_COMMUNITY): Payer: Medicare Other

## 2018-06-29 ENCOUNTER — Ambulatory Visit (HOSPITAL_COMMUNITY): Admission: RE | Admit: 2018-06-29 | Payer: Medicare Other | Source: Ambulatory Visit

## 2018-06-29 DIAGNOSIS — R1900 Intra-abdominal and pelvic swelling, mass and lump, unspecified site: Secondary | ICD-10-CM

## 2018-06-29 NOTE — Telephone Encounter (Signed)
Called Sue Ruiz in Radiology and requested CT scan from [facility] done on 05/11/18 to be powershared to [place].

## 2018-08-15 DIAGNOSIS — H9311 Tinnitus, right ear: Secondary | ICD-10-CM | POA: Insufficient documentation

## 2018-08-15 DIAGNOSIS — H903 Sensorineural hearing loss, bilateral: Secondary | ICD-10-CM | POA: Insufficient documentation

## 2018-08-15 NOTE — Progress Notes (Signed)
Consult Note: Gyn-Onc  Gerda Diss 73 y.o. female  CC:  Chief Complaint  Patient presents with  . Pelvic mass    HPI: Consult was requested by Dr. Lucillie Garfinkel for a pelvic mass (h/o TVH/BSO)  Ms. Sue Ruiz  is a very nice 73 y.o. who noted some right pelvic pain. Sometimes this radiates to the left. She reported this to a provider in [place] who imaged her early 2019 and told her she had a mass where her left ovary would normally sit. She had both her ovaries removed when she had her vaginal hysterectomy in 2013. The pathology and operative reports confirm removal of the bilateral ovaries. There is notation of a fundal fibroid but that, too, appears to have been removed with the hysterectomy. The ovaries seem to be complete on the pathology description.   On 11/03/17 she saw Dr. Royston Sinner with Ob/Gyn and a pelvic US confirmed the presence of a solid left adnexal mass (5.2 x 3.7 x 5.0cm). Given there should be no Gyn organs remaining she was referred to General Surgery (also for hernia) and then to [place] for further recommendations/management. General Surgery (Dr. Brantley Stage) feels she has diastasis recti not requiring surgical repair and ordered a CTAP. The scan confirmed a LEFT adnexal mass 5.0 x 4.2 x 4.1 cm.  Last colonoscopy >10 years ago. Has lower pelvic pain worse when she bends over.  MRI (10/19): 5 cm left adnexal mass, with nonspecific characteristics. Differential diagnosis includes a broad ligament fibroid, leiomyosarcoma, and other neurogenic or mesenchymal tumors. Surgical evaluation should be considered. No evidence of pelvic metastatic disease or other acute findings  January 2019 CTAP was done in [place], not available, but an ultrasound in followup showed the mass was stable. ~5x4cm.  Interval History:  She last had a visit with Dr. Gerarda Fraction 10/19 and her CA-125 was 4.  Discussion at that time included the following: 1. Data reviewed ? Reassuring is the lack of invasion into  any vital organs. ? There is no reassurance as to the etiology. ? Her CEA and CA125 were normal. 2. Etiology ? Could be ovarian remnant versus Non-GYN origin ? Fibroid possible - she did have fibroid at time of surgery   3. Reassuring is the presumed stability since Jan 2019  ? We will request the disc (CTScan) from [place] in Jan 2019 for radiology to issue comparison addendum to MRI. ? Assuming stability repeat imaging versus surgery an option. ? She wishes to proceed with observation ? RTC 6 months with imaging prior  12/19 CT: Reproductive: Previously demonstrated heterogeneous left adnexal mass. This currently measures 5.1 x 3.9 cm on image number 63 series 2. On 12/07/2017, this measured 5.0 x 4.2 cm in maximum dimensions. Surgically absent uterus. The right ovary is not visualized.  Other: No abdominal wall hernia or abnormality. No abdominopelvic ascites.  Musculoskeletal: Lumbar and lower thoracic spine degenerative changes.  IMPRESSION: 1. No acute abnormality. 2. No significant change in the previously demonstrated nonspecific left adnexal mass. 3. Stable cystocele. 4. Changes of COPD and chronic bronchitis. 5. Stable diffuse hepatic steatosis.  There is an outside CT scan from February 2020 that was scanned in and the images are available.  We were able to get the report out of care everywhere and it shows a 4 x 5 x 5 cm left adnexal mass that has undergone minimal if any change in size compared to 2014.  The patient continues to not desire surgery and her GI symptoms  are related more to her hiatal hernia.  She has bothered by the diastases but similarly does not want to have surgery for that.  She denies any significant abdominal or pelvic pain.  She denies any nausea vomiting fevers or chills.  Current Meds:  Outpatient Encounter Medications as of 08/16/2018  Medication Sig  . acetaminophen (TYLENOL) 325 MG tablet Take 650 mg by mouth as needed.  . Blood Glucose  Monitoring Suppl (GLUCOCOM BLOOD GLUCOSE MONITOR) DEVI 1 Units by Misc.(Non-Drug; Combo Route) route 4 times daily.  Marland Kitchen docusate sodium (COLACE) 250 MG capsule Take 1 capsule (250 mg total) by mouth daily.  Marland Kitchen esomeprazole (NEXIUM) 40 MG capsule 2 (two) times daily before a meal.   . glipiZIDE (GLUCOTROL) 5 MG tablet TK 1 T PO D  . glucose blood (RELION PRIME TEST) test strip Check blood sugar once in the morning before eating; two hours after dinner; and as needed for low blood sugar symptoms.  . hydrochlorothiazide (HYDRODIURIL) 25 MG tablet TAKE 1 TABLET BY MOUTH DAILY  . metFORMIN (GLUCOPHAGE) 1000 MG tablet TAKE 1 TABLET BY MOUTH TWICE DAILY  . pioglitazone (ACTOS) 15 MG tablet Take by mouth daily.   . pravastatin (PRAVACHOL) 40 MG tablet TAKE 1 TABLET BY MOUTH DAILY  . dicyclomine (BENTYL) 20 MG tablet   . polyethylene glycol (MIRALAX) packet Take 17 g by mouth 2 (two) times daily. Until bowel movements are soft (Patient not taking: Reported on 08/16/2018)  . triamcinolone ointment (KENALOG) 0.1 % APP THIN LAYER EXT AA BID  . [DISCONTINUED] glimepiride (AMARYL) 4 MG tablet TAKE 1 TABLET BY MOUTH DAILY   No facility-administered encounter medications on file as of 08/16/2018.     Allergy:  Allergies  Allergen Reactions  . Codeine Swelling    Tongue gets swollen and a rash  . Insulin Glargine Hives  . Losartan Other (See Comments)    Swelling   . Asa [Aspirin] Rash    Social Hx:   Social History   Socioeconomic History  . Marital status: Divorced    Spouse name: Not on file  . Number of children: Not on file  . Years of education: Not on file  . Highest education level: Not on file  Occupational History  . Not on file  Social Needs  . Financial resource strain: Not on file  . Food insecurity:    Worry: Not on file    Inability: Not on file  . Transportation needs:    Medical: Not on file    Non-medical: Not on file  Tobacco Use  . Smoking status: Former Smoker    Years:  52.00  . Smokeless tobacco: Never Used  Substance and Sexual Activity  . Alcohol use: Not Currently    Comment: "drank for 26 years, haven't touched a drink in 20 years"  . Drug use: Never  . Sexual activity: Not on file  Lifestyle  . Physical activity:    Days per week: Not on file    Minutes per session: Not on file  . Stress: Not on file  Relationships  . Social connections:    Talks on phone: Not on file    Gets together: Not on file    Attends religious service: Not on file    Active member of club or organization: Not on file    Attends meetings of clubs or organizations: Not on file    Relationship status: Not on file  . Intimate partner violence:    Fear  of current or ex partner: Not on file    Emotionally abused: Not on file    Physically abused: Not on file    Forced sexual activity: Not on file  Other Topics Concern  . Not on file  Social History Narrative  . Not on file    Past Surgical Hx:  Past Surgical History:  Procedure Laterality Date  . TOTAL VAGINAL HYSTERECTOMY  2013   with BSO - confirmed by op report and pathology. Done for prolapse    Past Medical Hx:  Past Medical History:  Diagnosis Date  . Diabetes mellitus without complication ([place])   . Fibromyalgia   . High cholesterol   . Hypertension   . IBS (irritable bowel syndrome)   . Vertigo     Oncology Hx:   No history exists.    Family Hx:  Family History  Problem Relation Age of Onset  . Diabetes Mother   . Hypertension Mother   . Diabetes Father   . Hypertension Father   . Diabetes Brother   . Hypertension Brother     Vitals:  Blood pressure (!) 149/70, pulse 99, temperature 98.2 F (36.8 C), temperature source Oral, resp. rate 19, height 5' (1.524 m), weight 151 lb (68.5 kg), SpO2 100 %.  Physical Exam: Well-nourished well-developed female in no acute distress.  Exam declined by the patient. Assessment/Plan: 73 year old with a 5 cm solid left adnexal mass who was  referred to [place] in 2019.  In review of the CT scan that she had in February 2020 in the [facility] system, they note that this mass has been present since 2014.  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  I suspect that there may have been a portion of fibroid that was not removed at the time of her vaginal hysterectomy.  She is asymptomatic and with stability of this lesion and her desire not to undergo surgery I believe that it can be followed symptomatically.  Release her from our clinic today.  She will follow-up with her other providers as scheduled.  She does undergo fairly frequent imaging for other indications.  We will of course be happy to see her should she develop symptoms or there is enlargement of the mass.  We appreciate the opportunity to partner in the care of this patient.    , A., MD 08/16/2018, 12:00 PM

## 2018-08-16 ENCOUNTER — Encounter: Payer: Self-pay | Admitting: Gynecologic Oncology

## 2018-08-16 ENCOUNTER — Telehealth: Payer: Self-pay | Admitting: *Deleted

## 2018-08-16 ENCOUNTER — Inpatient Hospital Stay: Payer: Medicare Other | Attending: Gynecologic Oncology | Admitting: Gynecologic Oncology

## 2018-08-16 ENCOUNTER — Other Ambulatory Visit: Payer: Self-pay

## 2018-08-16 VITALS — BP 149/70 | HR 99 | Temp 98.2°F | Resp 19 | Ht 60.0 in | Wt 151.0 lb

## 2018-08-16 DIAGNOSIS — Z90722 Acquired absence of ovaries, bilateral: Secondary | ICD-10-CM

## 2018-08-16 DIAGNOSIS — R1900 Intra-abdominal and pelvic swelling, mass and lump, unspecified site: Secondary | ICD-10-CM | POA: Diagnosis present

## 2018-08-16 DIAGNOSIS — Z90710 Acquired absence of both cervix and uterus: Secondary | ICD-10-CM

## 2018-08-16 NOTE — Patient Instructions (Signed)
It was a pleasure meeting with you today.  I did review your CT scan results from February 2020 and compared it to other scans.   They mention that the mass has not changed since 2014 based on their review.  I do not believe that this represents a malignancy.  We will discharge you from our clinic.  We be happy to see you in the future should the need arise.  Please follow-up with the other physicians as scheduled.

## 2018-08-16 NOTE — Telephone Encounter (Signed)
Per Lenna Sciara APP I have routed and fax today office note to Dr. Birdena Crandall and Dr. Howell Rucks

## 2018-08-29 DIAGNOSIS — D509 Iron deficiency anemia, unspecified: Secondary | ICD-10-CM | POA: Insufficient documentation

## 2018-10-12 MED ORDER — FENTANYL CITRATE (PF) 100 MCG/2ML IJ SOLN
INTRAMUSCULAR | Status: AC
  Filled 2018-10-12: qty 2

## 2018-10-12 MED ORDER — MIDAZOLAM HCL 2 MG/2ML IJ SOLN
INTRAMUSCULAR | Status: AC
  Filled 2018-10-12: qty 2

## 2018-10-12 MED ORDER — ROCURONIUM BROMIDE 10 MG/ML (PF) SYRINGE
PREFILLED_SYRINGE | INTRAVENOUS | Status: AC
  Filled 2018-10-12: qty 10

## 2018-10-19 DIAGNOSIS — M25551 Pain in right hip: Secondary | ICD-10-CM | POA: Insufficient documentation

## 2018-10-19 DIAGNOSIS — Z23 Encounter for immunization: Secondary | ICD-10-CM | POA: Insufficient documentation

## 2018-11-14 HISTORY — PX: COLONOSCOPY: SHX174

## 2019-04-03 DIAGNOSIS — N644 Mastodynia: Secondary | ICD-10-CM | POA: Insufficient documentation

## 2019-04-03 DIAGNOSIS — M797 Fibromyalgia: Secondary | ICD-10-CM | POA: Insufficient documentation

## 2019-11-06 IMAGING — MR MR PELVIS WO/W CM
8 of 10 series · 35 of 48 positions shown · IV contrast (multihance)
Comparison: CT on 12/07/2017

CLINICAL DATA: Abdominal pain and bloating for several months.
Pelvic mass on recent CT. Previous TAH-BSO.

Creatinine was obtained on site at [HOSPITAL] at [HOSPITAL].
Results: Creatinine 0.7 mg/dL.
EXAM:
MRI PELVIS WITHOUT AND WITH CONTRAST
TECHNIQUE: Multiplanar multisequence MR imaging of the pelvis was performed
both before and after administration of intravenous contrast.
CONTRAST:  13mL MULTIHANCE GADOBENATE DIMEGLUMINE 529 MG/ML IV SOLN

[Series 3: T2 · coronal · 5.0mm · 1.25mm/px · 2 of 30 slices shown (1 of 3)]
[im 1/30]
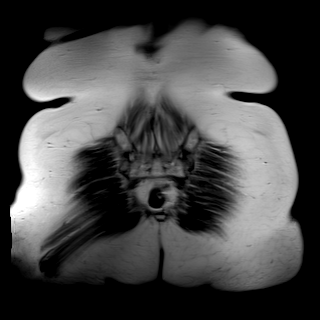
[im 30/30]
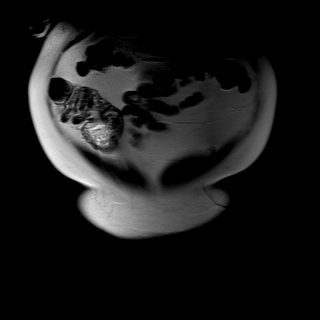

[Series 4: T2 · sagittal · 5.0mm · 0.78mm/px · 1 of 25 slices shown (2 of 3)]
[im 1/25]
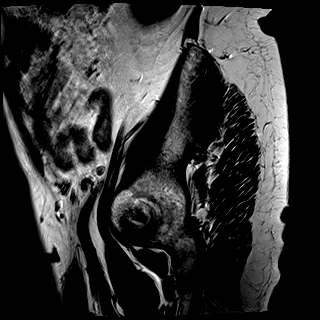

[Series 5: T2 · axial · 5.0mm · 0.41mm/px · z∈[-86,+88]mm · 2 of 30 slices shown (3 of 3)]
[im 1/30]
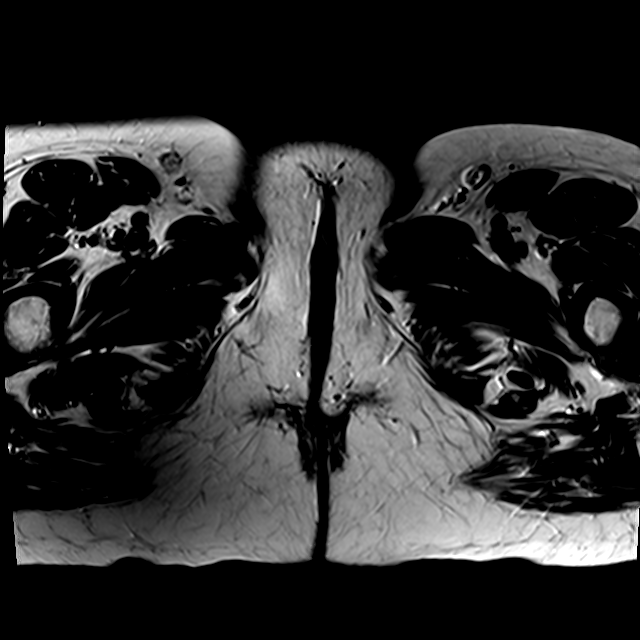
[im 30/30]
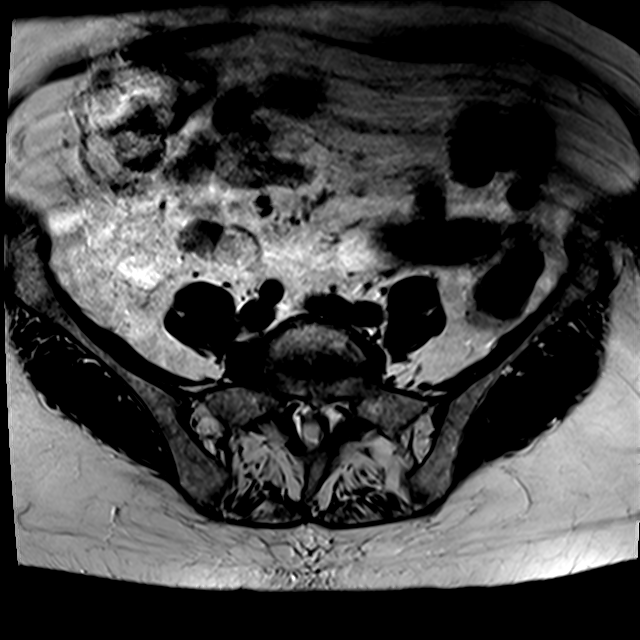

[Series 6: T2 fat-sat · axial · 5.0mm · 0.41mm/px · z∈[-86,+88]mm · 2 of 30 slices shown]
[im 1/30]
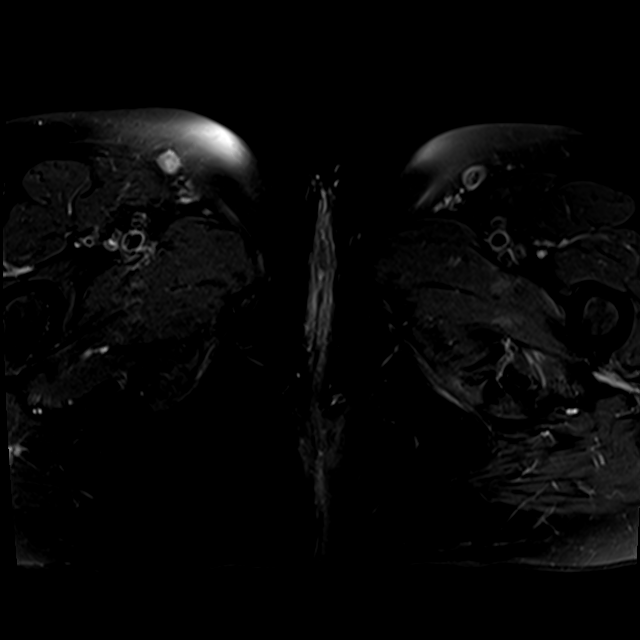
[im 30/30]
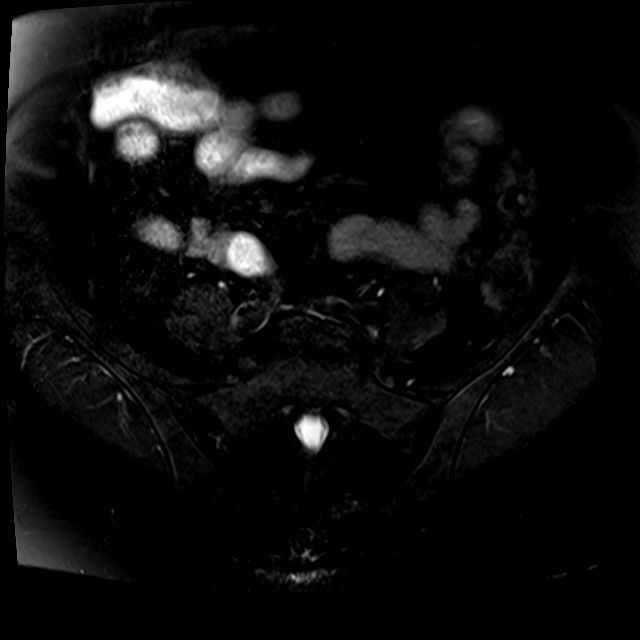

[Series 7: T1 · axial · 1.2mm · 0.75mm/px · z∈[-85,+87]mm · 8 of 144 slices shown]
[im 1/144]
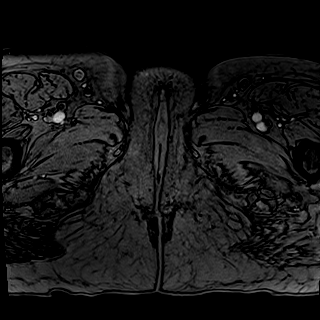
[im 21/144]
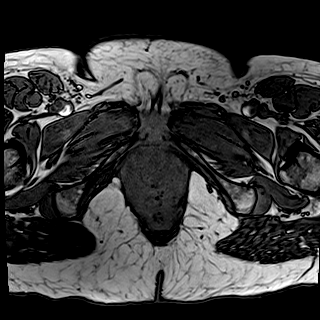
[im 41/144]
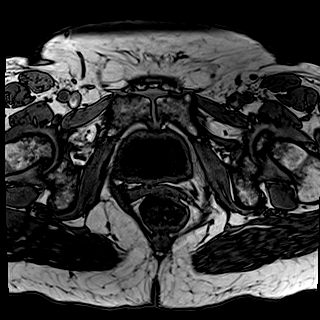
[im 62/144]
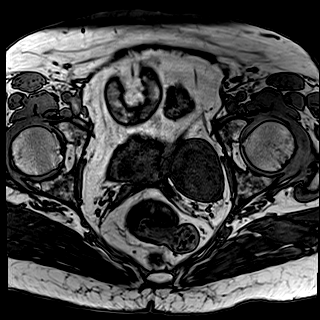
[im 82/144]
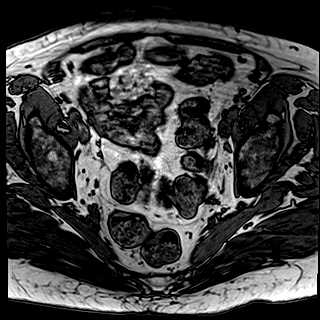
[im 103/144]
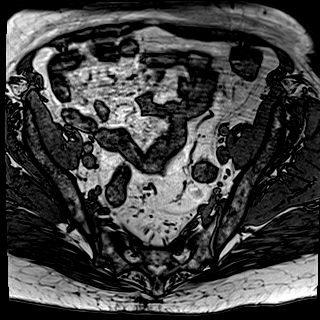
[im 123/144]
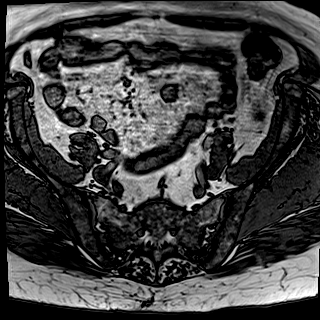
[im 144/144]
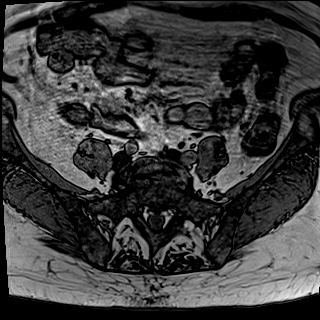

[Series 8: T1 fat-sat · axial · 1.2mm · 0.75mm/px · z∈[-85,+87]mm · 8 of 144 slices shown]
[im 1/144]
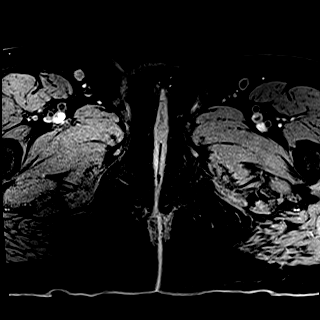
[im 21/144]
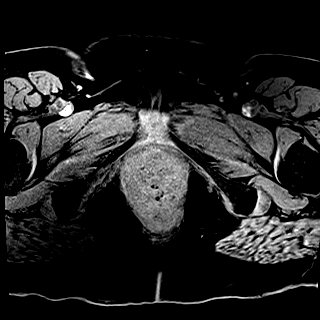
[im 41/144]
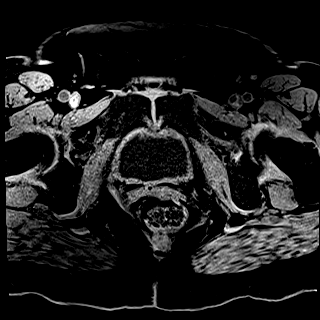
[im 62/144]
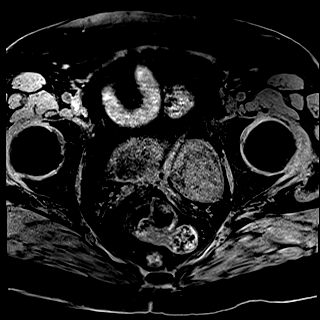
[im 82/144]
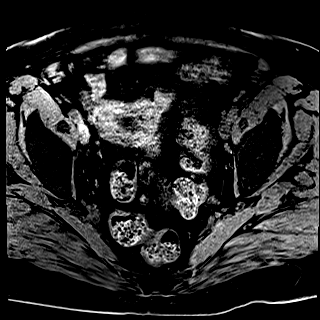
[im 103/144]
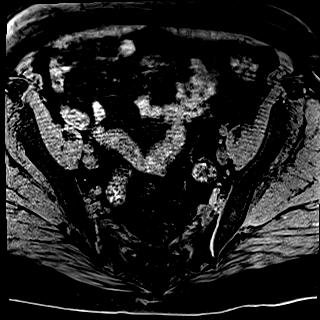
[im 123/144]
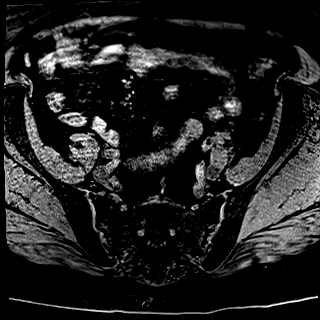
[im 144/144]
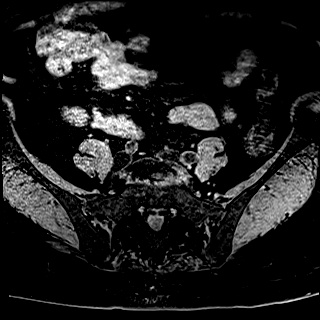

[Series 9: T1 fat-sat post-contrast · axial · 1.2mm · 0.75mm/px · z∈[-85,+87]mm · 8 of 144 slices shown (1 of 2)]
[im 1/144]
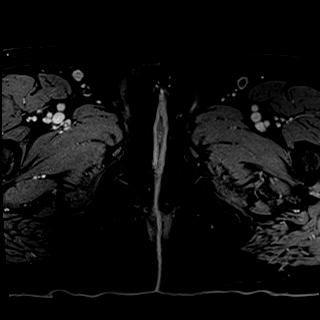
[im 21/144]
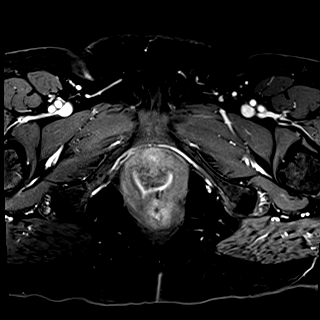
[im 41/144]
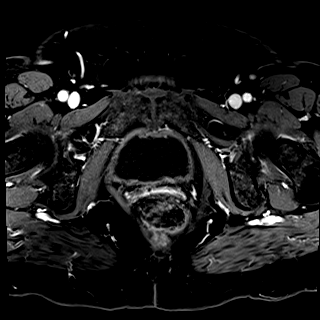
[im 62/144]
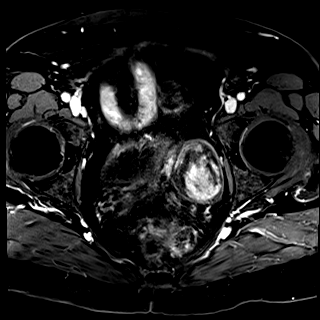
[im 82/144]
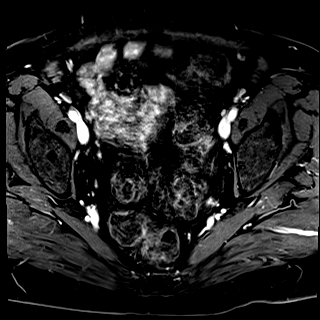
[im 103/144]
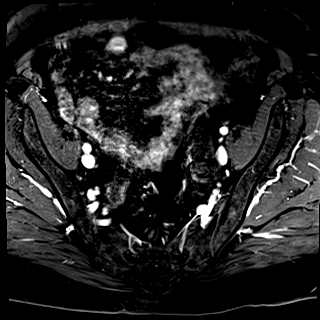
[im 123/144]
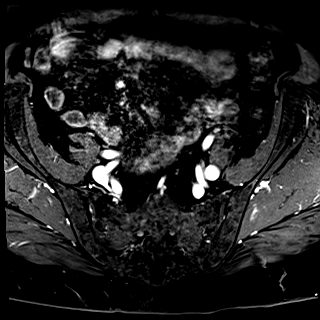
[im 144/144]
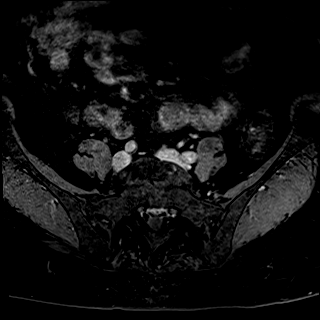

[Series 10: T1 fat-sat post-contrast · axial · 1.2mm · 0.75mm/px · z∈[-85,-12]mm · 4 of 144 slices shown (2 of 2)]
[im 1/144]
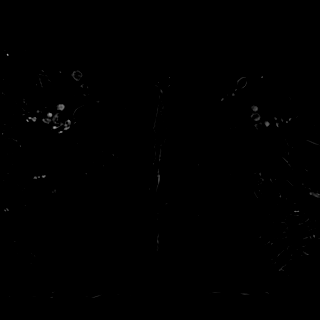
[im 21/144]
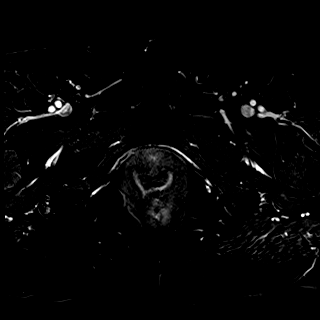
[im 41/144]
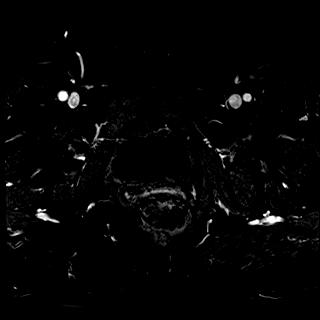
[im 62/144]
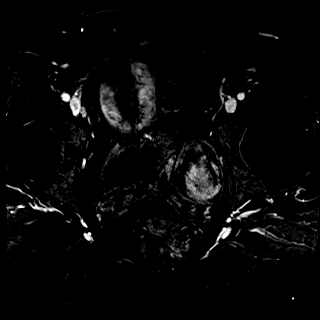

[35 of 48 positions shown; findings below may reference images not displayed]

FINDINGS: Urinary Tract: No urinary bladder or urethral abnormality.

Bowel: Unremarkable pelvic bowel loops.

Vascular/Lymphatic: Unremarkable. No pathologically enlarged pelvic
lymph nodes identified.

Reproductive:

-- Uterus: Surgically absent. Vaginal cuff is unremarkable in
appearance.

-- Right ovary: History of surgical removal. No right adnexal mass
identified.

-- Left ovary: History of surgical removal. A well-circumscribed
mass is seen in the left adnexa which shows heterogeneous pattern of
T2 hypointensity and hyperintensity, and postcontrast enhancement.
This mass measures 5.0 x 4.0 cm on image 18/30. There is no evidence
of invasion of adjacent structures. This has nonspecific
characteristics, and differential diagnosis includes a broad
ligament fibroid, and neurogenic or other mesenchymal tumors.

Other: No peritoneal thickening or abnormal free fluid.

Musculoskeletal:  Unremarkable.
IMPRESSION: 5 cm left adnexal mass, with nonspecific characteristics.
Differential diagnosis includes a broad ligament fibroid,
leiomyosarcoma, and other neurogenic or mesenchymal tumors. Surgical
evaluation should be considered.

No evidence of pelvic metastatic disease or other acute findings.

Prior hysterectomy.

## 2019-12-04 DIAGNOSIS — E538 Deficiency of other specified B group vitamins: Secondary | ICD-10-CM | POA: Insufficient documentation

## 2019-12-04 DIAGNOSIS — Z2821 Immunization not carried out because of patient refusal: Secondary | ICD-10-CM | POA: Insufficient documentation

## 2020-12-02 ENCOUNTER — Ambulatory Visit: Payer: Medicare Other | Admitting: Endocrinology

## 2021-01-06 ENCOUNTER — Ambulatory Visit: Payer: Medicare Other | Admitting: Endocrinology

## 2021-01-20 ENCOUNTER — Other Ambulatory Visit: Payer: Self-pay

## 2021-01-20 ENCOUNTER — Ambulatory Visit (INDEPENDENT_AMBULATORY_CARE_PROVIDER_SITE_OTHER): Payer: Medicare Other | Admitting: Endocrinology

## 2021-01-20 VITALS — BP 158/68 | HR 103 | Ht 60.0 in | Wt 160.8 lb

## 2021-01-20 DIAGNOSIS — E119 Type 2 diabetes mellitus without complications: Secondary | ICD-10-CM | POA: Diagnosis not present

## 2021-01-20 LAB — POCT GLYCOSYLATED HEMOGLOBIN (HGB A1C): Hemoglobin A1C: 7.7 % — AB (ref 4.0–5.6)

## 2021-01-20 MED ORDER — REPAGLINIDE 1 MG PO TABS: 1.0000 mg | ORAL_TABLET | Freq: Two times a day (BID) | ORAL | 3 refills | Status: DC

## 2021-01-20 NOTE — Patient Instructions (Addendum)
good diet and exercise significantly improve the control of your diabetes.  please let me know if you wish to be referred to a dietician.  high blood sugar is very risky to your health.  you should see an eye doctor and dentist every year.  It is very important to get all recommended vaccinations.  Controlling your blood pressure and cholesterol drastically reduces the damage diabetes does to your body.  Those who smoke should quit.  Please discuss these with your doctor.  check your blood sugar once a day.  vary the time of day when you check, between before the 3 meals, and at bedtime.  also check if you have symptoms of your blood sugar being too high or too low.  please keep a record of the readings and bring it to your next appointment here (or you can bring the meter itself).  You can write it on any piece of paper.  please call us sooner if your blood sugar goes below 70, or if most of your readings are over 200.   We will need to take this complex situation in stages.   For now, please: Stop taking the pioglitazone, and:  Please continue the same metformin, and:  I have sent a prescription to your pharmacy, to change glipizide to repaglinide.   Please come back for a follow-up appointment in 1 month.

## 2021-01-20 NOTE — Progress Notes (Signed)
Subjective:    Patient ID: Sue Ruiz, female    DOB: 1945-03-18, 75 y.o.   MRN: [id number]  HPI pt is referred by Dr Birdena Crandall, for diabetes.  Pt states DM was dx'ed in 5188; it is complicated by PAD; she has never been on insulin; pt says his diet and exercise are good; he has never had GDM, pancreatitis, pancreatic surgery, severe hypoglycemia or DKA.  She reports nausea/constipation, due to iron.  She says cbg varies from 128-178.   Past Medical History:  Diagnosis Date   Diabetes mellitus without complication (HCC)    Fibromyalgia    High cholesterol    Hypertension    IBS (irritable bowel syndrome)    Vertigo     Past Surgical History:  Procedure Laterality Date   TOTAL VAGINAL HYSTERECTOMY  2013   with BSO - confirmed by op report and pathology. Done for prolapse    Social History   Socioeconomic History   Marital status: Divorced    Spouse name: Not on file   Number of children: Not on file   Years of education: Not on file   Highest education level: Not on file  Occupational History   Not on file  Tobacco Use   Smoking status: Former    Years: 52.00    Types: Cigarettes   Smokeless tobacco: Never  Vaping Use   Vaping Use: Never used  Substance and Sexual Activity   Alcohol use: Not Currently    Comment: "drank for 26 years, haven't touched a drink in 20 years"   Drug use: Never   Sexual activity: Not on file  Other Topics Concern   Not on file  Social History Narrative   Not on file   Social Determinants of Health   Financial Resource Strain: Not on file  Food Insecurity: Not on file  Transportation Needs: Not on file  Physical Activity: Not on file  Stress: Not on file  Social Connections: Not on file  Intimate Partner Violence: Not on file    Current Outpatient Medications on File Prior to Visit  Medication Sig Dispense Refill   acetaminophen (TYLENOL) 325 MG tablet Take 650 mg by mouth as needed.     Blood Glucose Monitoring Suppl  (GLUCOCOM BLOOD GLUCOSE MONITOR) DEVI 1 Units by Misc.(Non-Drug; Combo Route) route 4 times daily.     dicyclomine (BENTYL) 20 MG tablet      docusate sodium (COLACE) 250 MG capsule Take 1 capsule (250 mg total) by mouth daily. 10 capsule 0   esomeprazole (NEXIUM) 40 MG capsule 2 (two) times daily before a meal.      glucose blood test strip Check blood sugar once in the morning before eating; two hours after dinner; and as needed for low blood sugar symptoms.     hydrochlorothiazide (HYDRODIURIL) 25 MG tablet TAKE 1 TABLET BY MOUTH DAILY     metFORMIN (GLUCOPHAGE) 1000 MG tablet TAKE 1 TABLET BY MOUTH TWICE DAILY     polyethylene glycol (MIRALAX) packet Take 17 g by mouth 2 (two) times daily. Until bowel movements are soft 14 each 0   pravastatin (PRAVACHOL) 40 MG tablet TAKE 1 TABLET BY MOUTH DAILY     triamcinolone ointment (KENALOG) 0.1 % APP THIN LAYER EXT AA BID  1   No current facility-administered medications on file prior to visit.    Allergies  Allergen Reactions   Codeine Swelling    Tongue gets swollen and a rash   Insulin Glargine Hives  Losartan Other (See Comments)    Swelling    Asa [Aspirin] Rash    Family History  Problem Relation Age of Onset   Diabetes Mother    Hypertension Mother    Diabetes Father    Hypertension Father    Diabetes Brother    Hypertension Brother     BP (!) 158/68 (BP Location: Right Arm, Patient Position: Sitting, Cuff Size: Normal)   Pulse (!) 103   Ht 5' (1.524 m)   Wt 160 lb 12.8 oz (72.9 kg)   SpO2 95%   BMI 31.40 kg/m   Review of Systems denies weight loss, sob, hypoglycemia, and memory loss.      Objective:   Physical Exam Pulses: dorsalis pedis intact bilat.   MSK: no deformity of the feet CV: 1+ bilat leg edema Skin:  no ulcer on the feet.  normal color and temp on the feet.   Neuro: sensation is intact to touch on the feet.    Lab Results  Component Value Date   HGBA1C 7.7 (A) 01/20/2021   Lab Results   Component Value Date   CREATININE 0.68 02/23/2018   BUN 10 02/23/2018   NA 138 02/23/2018   K 3.8 02/23/2018   CL 100 02/23/2018   CO2 27 02/23/2018   I have reviewed outside records, and summarized: Pt was noted to have elevated A1c, and referred here.  Dyslipidemia, anemia, FM, and IBS were also addressed.       Assessment & Plan:  Edema, due to pioglitazone.   Type 2 DM: uncontrolled.  She declines any injection.  I advised ref CDE, to learn about injections, and she declines even that.    Patient Instructions  good diet and exercise significantly improve the control of your diabetes.  please let me know if you wish to be referred to a dietician.  high blood sugar is very risky to your health.  you should see an eye doctor and dentist every year.  It is very important to get all recommended vaccinations.  Controlling your blood pressure and cholesterol drastically reduces the damage diabetes does to your body.  Those who smoke should quit.  Please discuss these with your doctor.  check your blood sugar once a day.  vary the time of day when you check, between before the 3 meals, and at bedtime.  also check if you have symptoms of your blood sugar being too high or too low.  please keep a record of the readings and bring it to your next appointment here (or you can bring the meter itself).  You can write it on any piece of paper.  please call us sooner if your blood sugar goes below 70, or if most of your readings are over 200.   We will need to take this complex situation in stages.   For now, please: Stop taking the pioglitazone, and:  Please continue the same metformin, and:  I have sent a prescription to your pharmacy, to change glipizide to repaglinide.   Please come back for a follow-up appointment in 1 month.

## 2021-02-04 ENCOUNTER — Telehealth: Payer: Self-pay | Admitting: Endocrinology

## 2021-02-04 NOTE — Telephone Encounter (Signed)
Patient states that she is taking Repaglinide and her sugar levels are staying over 200.  What can she do?repaglinide (PRANDIN) 1 MG tablet  [facility] [phone] - [place], [place] (Ph: [phone])

## 2021-02-05 MED ORDER — REPAGLINIDE 1 MG PO TABS: 2.0000 mg | ORAL_TABLET | Freq: Two times a day (BID) | ORAL | 3 refills | Status: DC

## 2021-02-05 NOTE — Telephone Encounter (Signed)
11/23 7:05am- 145  11/22 7:45am 193  11/21 7:40am 202   11/21 noon- 281  11/21 12:50pm- 230  Patient states that she has been taking medication as prescribed. Patient is aware that Dr. Loanne Drilling is on vacation and that the message will go to on call provider

## 2021-02-05 NOTE — Telephone Encounter (Signed)
Patient advise to take the Repaglinide 1mg  at breakfast, lunch , and dinner and not to snack in between meals. Patient verbalized understanding and will keep follow up.

## 2021-02-20 ENCOUNTER — Ambulatory Visit: Payer: Medicare Other | Admitting: Endocrinology

## 2021-02-26 ENCOUNTER — Ambulatory Visit: Payer: Medicare Other | Admitting: Endocrinology

## 2021-04-23 ENCOUNTER — Encounter: Payer: Self-pay | Admitting: Internal Medicine

## 2021-06-10 DIAGNOSIS — I700 Atherosclerosis of aorta: Secondary | ICD-10-CM | POA: Insufficient documentation

## 2021-06-10 DIAGNOSIS — F1010 Alcohol abuse, uncomplicated: Secondary | ICD-10-CM | POA: Insufficient documentation

## 2021-06-10 DIAGNOSIS — F0390 Unspecified dementia without behavioral disturbance: Secondary | ICD-10-CM | POA: Insufficient documentation

## 2021-06-10 DIAGNOSIS — Z862 Personal history of diseases of the blood and blood-forming organs and certain disorders involving the immune mechanism: Secondary | ICD-10-CM | POA: Insufficient documentation

## 2021-06-10 DIAGNOSIS — Z87891 Personal history of nicotine dependence: Secondary | ICD-10-CM | POA: Insufficient documentation

## 2021-06-10 DIAGNOSIS — Z90710 Acquired absence of both cervix and uterus: Secondary | ICD-10-CM | POA: Insufficient documentation

## 2021-06-12 DIAGNOSIS — D8989 Other specified disorders involving the immune mechanism, not elsewhere classified: Secondary | ICD-10-CM | POA: Insufficient documentation

## 2021-10-15 HISTORY — PX: ESOPHAGOGASTRODUODENOSCOPY: SHX1529

## 2021-12-04 DIAGNOSIS — C679 Malignant neoplasm of bladder, unspecified: Secondary | ICD-10-CM | POA: Insufficient documentation

## 2021-12-09 ENCOUNTER — Other Ambulatory Visit: Payer: Self-pay | Admitting: Internal Medicine

## 2021-12-10 ENCOUNTER — Other Ambulatory Visit: Payer: Self-pay | Admitting: Internal Medicine

## 2021-12-10 DIAGNOSIS — R319 Hematuria, unspecified: Secondary | ICD-10-CM

## 2021-12-12 ENCOUNTER — Ambulatory Visit
Admission: RE | Admit: 2021-12-12 | Discharge: 2021-12-12 | Disposition: A | Discharge status: Home / Self Care | Payer: Medicare Other | Source: Ambulatory Visit | Attending: Internal Medicine | Admitting: Internal Medicine

## 2021-12-12 DIAGNOSIS — R319 Hematuria, unspecified: Secondary | ICD-10-CM

## 2021-12-16 DIAGNOSIS — R310 Gross hematuria: Secondary | ICD-10-CM | POA: Insufficient documentation

## 2021-12-19 ENCOUNTER — Emergency Department (HOSPITAL_COMMUNITY)
Admission: EM | Admit: 2021-12-19 | Discharge: 2021-12-19 | Disposition: A | Discharge status: Home / Self Care | Payer: Medicare Other | Attending: Emergency Medicine | Admitting: Emergency Medicine

## 2021-12-19 ENCOUNTER — Emergency Department (HOSPITAL_COMMUNITY): Payer: Medicare Other

## 2021-12-19 ENCOUNTER — Other Ambulatory Visit: Payer: Self-pay

## 2021-12-19 DIAGNOSIS — Z79899 Other long term (current) drug therapy: Secondary | ICD-10-CM | POA: Insufficient documentation

## 2021-12-19 DIAGNOSIS — Z7984 Long term (current) use of oral hypoglycemic drugs: Secondary | ICD-10-CM | POA: Diagnosis not present

## 2021-12-19 DIAGNOSIS — R079 Chest pain, unspecified: Secondary | ICD-10-CM | POA: Insufficient documentation

## 2021-12-19 DIAGNOSIS — E119 Type 2 diabetes mellitus without complications: Secondary | ICD-10-CM | POA: Insufficient documentation

## 2021-12-19 DIAGNOSIS — I10 Essential (primary) hypertension: Secondary | ICD-10-CM | POA: Diagnosis not present

## 2021-12-19 DIAGNOSIS — Z20822 Contact with and (suspected) exposure to covid-19: Secondary | ICD-10-CM | POA: Diagnosis not present

## 2021-12-19 HISTORY — DX: Chest pain, unspecified: R07.9

## 2021-12-19 LAB — CBC
HCT: 37.7 % (ref 36.0–46.0)
Hemoglobin: 12.5 g/dL (ref 12.0–15.0)
MCH: 29.8 pg (ref 26.0–34.0)
MCHC: 33.2 g/dL (ref 30.0–36.0)
MCV: 89.8 fL (ref 80.0–100.0)
Platelets: 289 10*3/uL (ref 150–400)
RBC: 4.2 MIL/uL (ref 3.87–5.11)
RDW: 14.4 % (ref 11.5–15.5)
WBC: 14.8 10*3/uL — ABNORMAL HIGH (ref 4.0–10.5)
nRBC: 0 % (ref 0.0–0.2)

## 2021-12-19 LAB — BASIC METABOLIC PANEL
Anion gap: 13 (ref 5–15)
BUN: 13 mg/dL (ref 8–23)
CO2: 28 mmol/L (ref 22–32)
Calcium: 9.3 mg/dL (ref 8.9–10.3)
Chloride: 97 mmol/L — ABNORMAL LOW (ref 98–111)
Creatinine, Ser: 0.74 mg/dL (ref 0.44–1.00)
GFR, Estimated: >60 mL/min (ref >60)
Glucose, Bld: 166 mg/dL — ABNORMAL HIGH (ref 70–99)
Potassium: 3.4 mmol/L — ABNORMAL LOW (ref 3.5–5.1)
Sodium: 138 mmol/L (ref 135–145)

## 2021-12-19 LAB — RESP PANEL BY RT-PCR (FLU A&B, COVID) ARPGX2
Influenza A by PCR: NEGATIVE
Influenza B by PCR: NEGATIVE
SARS Coronavirus 2 by RT PCR: NEGATIVE

## 2021-12-19 LAB — CBG MONITORING, ED: Glucose-Capillary: 151 mg/dL — ABNORMAL HIGH (ref 70–99)

## 2021-12-19 LAB — TROPONIN I (HIGH SENSITIVITY)
Troponin I (High Sensitivity): 3 ng/L (ref <18)
Troponin I (High Sensitivity): 5 ng/L (ref <18)

## 2021-12-19 LAB — LIPASE, BLOOD: Lipase: 26 U/L (ref 11–51)

## 2021-12-19 LAB — MAGNESIUM: Magnesium: 1.6 mg/dL — ABNORMAL LOW (ref 1.7–2.4)

## 2021-12-19 MED ORDER — ACETAMINOPHEN 500 MG PO TABS
1000.0000 mg | ORAL_TABLET | Freq: Once | ORAL | Status: AC
  Administered 2021-12-19: 1000 mg via ORAL
  Filled 2021-12-19: qty 2

## 2021-12-19 MED ORDER — SODIUM CHLORIDE 0.9 % IV BOLUS
1000.0000 mL | Freq: Once | INTRAVENOUS | Status: AC
  Administered 2021-12-19: 1000 mL via INTRAVENOUS

## 2021-12-19 MED ORDER — FENTANYL CITRATE PF 50 MCG/ML IJ SOSY
25.0000 ug | PREFILLED_SYRINGE | Freq: Once | INTRAMUSCULAR | Status: AC
  Administered 2021-12-19: 25 ug via INTRAVENOUS
  Filled 2021-12-19: qty 1

## 2021-12-19 MED ORDER — ONDANSETRON HCL 4 MG/2ML IJ SOLN
4.0000 mg | Freq: Once | INTRAMUSCULAR | Status: AC
  Administered 2021-12-19: 4 mg via INTRAVENOUS
  Filled 2021-12-19: qty 2

## 2021-12-19 MED ORDER — IOHEXOL 350 MG/ML SOLN
75.0000 mL | Freq: Once | INTRAVENOUS | Status: AC | PRN
  Administered 2021-12-19: 75 mL via INTRAVENOUS

## 2021-12-19 NOTE — ED Notes (Signed)
Patient verbalizes understanding of discharge instructions. Opportunity for questioning and answers were provided. Armband removed by staff, pt discharged from ED. Pt taken to ED waiting room via wheel chair.  

## 2021-12-19 NOTE — Discharge Instructions (Signed)
We evaluated you for your chest pain.  Your testing did not show any dangerous process.  Please follow-up closely with your primary doctor.  Return to the emergency department if your symptoms recur or worsen or you develop any new symptoms.

## 2021-12-19 NOTE — ED Provider Notes (Signed)
  Physical Exam  BP 119/61   Pulse 99   Temp 98.2 F (36.8 C) (Oral)   Resp 17   Ht 5' (1.524 m)   Wt 73 kg   SpO2 100%   BMI 31.44 kg/m     ED Course / MDM   Clinical Course as of 12/19/21 2206  Fri Dec 19, 2021  1326 New RBBB on EKG but prior EKG is 2005. Will obtain labs including troponins and serial EKGs. [HN]  1508 Troponin I (High Sensitivity): 3 [HN]  1517 Received sign out pending delta troponin and CTA chest [WS]  2104 CTA chest negative.  Shows tiny foci of air thought to be tracheal or esophageal diverticulum, no sign of pneumomediastinum or mediastinitis.  No PE no dissection.  Patient developed some tachycardia while being observed, responded to IV fluids.  Sent COVID and flu swabs which were negative.  Patient request discharge she reports that all of her symptoms have resolved and she wants to go home.  Will discharge patient to home. All questions answered. Patient comfortable with plan of discharge. Return precautions discussed with patient and specified on the after visit summary.  [WS]    Clinical Course User Index [HN] Audley Hose, MD [WS] Cristie Hem, MD   Medical Decision Making Amount and/or Complexity of Data Reviewed Labs: ordered. Decision-making details documented in ED Course. Radiology: ordered.  Risk OTC drugs. Prescription drug management.         Cristie Hem, MD 12/19/21 2206

## 2021-12-19 NOTE — ED Provider Notes (Signed)
[facility] EMERGENCY DEPARTMENT Provider Note   CSN: [id number] Arrival date & time: 12/19/21  1310     History  Chief Complaint  Patient presents with   Chest Pain    Sue Ruiz is a 76 y.o. female with HTN, HLD, GERD, T2DM, IBS, status post TAH/BSO, presents with chest pain.   10/10 acute onset chest pain radiating into neck that started at 1145 AM today while she was watching TV. No h/o similar. +N/V. Also diarrhea. Denies abdominal pain. Denies recent illnesses, cough, fevers/chills.   Chest Pain      Home Medications Prior to Admission medications   Medication Sig Start Date End Date Taking? Authorizing Provider  acetaminophen (TYLENOL) 325 MG tablet Take 650 mg by mouth as needed.    [provider]  Blood Glucose Monitoring Suppl (GLUCOCOM BLOOD GLUCOSE MONITOR) DEVI 1 Units by Misc.(Non-Drug; Combo Route) route 4 times daily. 04/11/14   [provider]  dicyclomine (BENTYL) 20 MG tablet  05/18/18   [provider]  docusate sodium (COLACE) 250 MG capsule Take 1 capsule (250 mg total) by mouth daily. 02/23/18   Duffy Bruce, MD  esomeprazole (NEXIUM) 40 MG capsule 2 (two) times daily before a meal.  12/15/17   [provider]  glucose blood test strip Check blood sugar once in the morning before eating; two hours after dinner; and as needed for low blood sugar symptoms. 09/15/17   [provider]  hydrochlorothiazide (HYDRODIURIL) 25 MG tablet TAKE 1 TABLET BY MOUTH DAILY 12/15/17   [provider]  metFORMIN (GLUCOPHAGE) 1000 MG tablet TAKE 1 TABLET BY MOUTH TWICE DAILY 07/13/17   [provider]  polyethylene glycol (MIRALAX) packet Take 17 g by mouth 2 (two) times daily. Until bowel movements are soft 02/23/18   Duffy Bruce, MD  pravastatin (PRAVACHOL) 40 MG tablet TAKE 1 TABLET BY MOUTH DAILY 12/15/17   [provider]  repaglinide (PRANDIN) 1 MG tablet Take 2 tablets (2 mg  total) by mouth 2 (two) times daily before a meal. 02/05/21   Renato Shin, MD  triamcinolone ointment (KENALOG) 0.1 % APP THIN LAYER EXT AA BID 10/07/17   [provider]      Allergies    Codeine, Insulin glargine, Losartan, and Asa [aspirin]    Review of Systems   Review of Systems  Cardiovascular:  Positive for chest pain.   Review of systems negative for recent illnesses.  A 10 point review of systems was performed and is negative unless otherwise reported in HPI.  Physical Exam Updated Vital Signs BP (!) 127/53   Pulse (!) 102   Temp 97.8 F (36.6 C) (Oral)   Resp (!) 26   Ht 5' (1.524 m)   Wt 73 kg   SpO2 92%   BMI 31.44 kg/m  Physical Exam General: Uncomfortable- appearing female, lying in bed.  HEENT: PERRLA, Sclera anicteric, MMM, trachea midline. Cardiology: RRR, no murmurs/rubs/gallops. BL radial and DP pulses equal bilaterally.  Resp: Normal respiratory rate and effort. CTAB, no wheezes, rhonchi, crackles.  Abd: Soft, non-tender, non-distended. No rebound tenderness or guarding.  GU: Deferred. MSK: No peripheral edema or signs of trauma. Extremities without deformity or TTP. No cyanosis or clubbing. Skin: warm, dry. No rashes or lesions. Neuro: A&Ox4, CNs II-XII grossly intact. MAEs. Sensation grossly intact.  Psych: Normal mood and affect.   ED Results / Procedures / Treatments   Labs (all labs ordered are listed, but only abnormal results are  displayed) Labs Reviewed  BASIC METABOLIC PANEL - Abnormal; Notable for the following components:      Result Value   Potassium 3.4 (*)    Chloride 97 (*)    Glucose, Bld 166 (*)    All other components within normal limits  CBC - Abnormal; Notable for the following components:   WBC 14.8 (*)    All other components within normal limits  MAGNESIUM - Abnormal; Notable for the following components:   Magnesium 1.6 (*)    All other components within normal limits  LIPASE, BLOOD  TROPONIN I (HIGH  SENSITIVITY)  TROPONIN I (HIGH SENSITIVITY)    EKG EKG Interpretation  Date/Time:  Friday December 19 2021 13:20:46 EDT Ventricular Rate:  96 PR Interval:  170 QRS Duration: 126 QT Interval:  374 QTC Calculation: 473 R Axis:   40 Text Interpretation: Sinus rhythm Right bundle branch block Confirmed by Cindee Lame [phone]) on 12/19/2021 1:26:34 PM  Radiology DG Chest 2 View  Result Date: 12/19/2021 CLINICAL DATA:  Chest pain EXAM: CHEST - 2 VIEW COMPARISON:  Radiographs 11/19/2003 FINDINGS: Mild cardiomegaly.  Aortic atherosclerotic calcification. Bibasilar atelectasis. Focal airspace opacity in the right cardiophrenic angle. No pleural effusion, or pneumothorax. No acute osseous abnormality. IMPRESSION: Pneumonia versus atelectasis in the right cardiophrenic angle. Follow-up in 4-6 weeks is recommended to ensure resolution. Electronically Signed   By: Placido Sou M.D.   On: 12/19/2021 14:10       EMERGENCY DEPARTMENT [place] CARDIAC EXAM "Study: Limited Ultrasound of the Heart and Pericardium"  INDICATIONS:Chest pain Multiple views of the heart and pericardium were obtained in real-time with a multi-frequency probe.  PERFORMED WC:BJSEGB IMAGES ARCHIVED?: Yes LIMITATIONS:   Patient cannot reposition d/t discomfort VIEWS USED: Subcostal 4 chamber, Parasternal long axis, Parasternal short axis, Apical 4 chamber , and Inferior Vena Cava INTERPRETATION:  Normal EF. No D-sign or signs of RHS. No dilated thoracic aortic outlet or obvious ascending dissection. Pericardial fat pad vs small pericardial effusion. IVC is appropriately collapsible.   Procedures Procedures    Medications Ordered in ED Medications  fentaNYL (SUBLIMAZE) injection 25 mcg (has no administration in time range)  ondansetron (ZOFRAN) injection 4 mg (4 mg Intravenous Given 12/19/21 1428)    ED Course/ Medical Decision Making/ A&P                          Medical Decision Making Amount and/or Complexity of  Data Reviewed Labs: ordered. Radiology: ordered.  Risk Prescription drug management.   Patient is an uncomfortable appearing 76 year old female with history of hypertension who presents with acute onset chest pain that radiates into her neck.  DDX for chest pain includes but is not limited to:  ACS/arrhythmia,  PE, aortic dissection.  Was unable to visualize abdominal aorta due to body habitus via ultrasound but will obtain CTA to evaluate for thoracic dissection and PE.  EKG demonstrates a new right bundle branch block, but most recent prior EKG was completed in 2005.  Also consider cardiac tamponade as bedside ultrasound demonstrated possible Perico effusion versus pericardial fat pad, however patient's blood pressure stable and IVC is appropriately collapsible.  Patient has no cough, shortness of breath, abdominal pain to suggest PNA, PTX, biliary disease.  Also consider musculoskeletal pain versus GERD/gastritis, pancreatitis.  First troponin is negative.  Pending repeat.  Patient's white blood cells 14.8, K3.4, magnesium 1.6, otherwise unremarkable CMP.  Chest x-ray demonstrates mild cardiomegaly and pneumonia versus atelectasis in the  right costophrenic angle.  Again, most recent chest x-rays in 2005.  Patient has an allergy to codeine that involves oropharyngeal swelling.  Discussed with pharmacy and will give synthetic opiate fentanyl.  I have personally reviewed and interpreted all labs and imaging.   Clinical Course as of 12/19/21 1539  Fri Dec 19, 2021  1326 New RBBB on EKG but prior EKG is 2005. Will obtain labs including troponins and serial EKGs. [HN]  1508 Troponin I (High Sensitivity): 3 [HN]  1517 Received sign out pending delta troponin and CTA chest [WS]    Clinical Course User Index [HN] Audley Hose, MD [WS] Cristie Hem, MD    Patient is signed out to the oncoming ED physician who is made aware of her history, presentation, exam, workup, and plan.  Plan  is to obtain CTA chest, repeat troponin and reevaluate patient.         Final Clinical Impression(s) / ED Diagnoses Final diagnoses:  Chest pain, unspecified type    Rx / DC Orders ED Discharge Orders     None        This note was created using dictation software, which may contain spelling or grammatical errors.    Audley Hose, MD 12/19/21 [phone]

## 2021-12-19 NOTE — ED Notes (Signed)
Pts family making statements toward this RN stating "they are just weak" when attempting to assist pt back into bed. This RN explained that I wish to not hurt self while attempting to help pt and will request assistance to get pt up in bed. 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 This RN explained once again that it is inappropriate to hurt self while attempting to get pt back into bed and this RN will request assistance. Pt situated self in bed and did not allow this RN to request assistance from additional staff, family requesting to speak to "higher up". Charge RN made aware.

## 2021-12-19 NOTE — ED Triage Notes (Addendum)
Pt arrived via [facility] from home with chest pain. Per EMS pt states chest pain started at 1045 12/19/21. Pt stated she was sitting and eat mint when chest pain radiated to throat. Hx of GERD. Pt allergic to ASA and states "makes her break out into hives".    164/62 --> 128/64, 92HR, 97%RA, CBG 225 2 Nitro

## 2021-12-19 NOTE — ED Notes (Signed)
Got patient on the monitor did ekg shown er provider patient is resting with call bell in reach patient is in a gown

## 2022-03-16 DIAGNOSIS — K0889 Other specified disorders of teeth and supporting structures: Secondary | ICD-10-CM

## 2022-03-16 HISTORY — DX: Other specified disorders of teeth and supporting structures: K08.89

## 2022-07-15 ENCOUNTER — Other Ambulatory Visit: Payer: Self-pay | Admitting: Urology

## 2022-07-21 ENCOUNTER — Encounter (HOSPITAL_BASED_OUTPATIENT_CLINIC_OR_DEPARTMENT_OTHER): Payer: Self-pay | Admitting: Urology

## 2022-07-21 ENCOUNTER — Other Ambulatory Visit: Payer: Self-pay

## 2022-07-21 NOTE — Progress Notes (Addendum)
Spoke w/ via phone for pre-op interview---Sue Ruiz needs dos---- ISTAT              Ruiz results------12/19/21 chest xray in Epic, 12/19/21 EKG in chart & Epic COVID test -----patient states asymptomatic no test needed Arrive at -------0900 NPO after MN NO Solid Food.  Clear liquids from MN until---0800 Med rec completed Medications to take morning of surgery -----Nexium, Pravachol Diabetic medication -----Hold Diabetic medication the morning of surgery. Patient instructed no nail polish to be worn day of surgery Patient instructed to bring photo id and insurance card day of surgery Patient aware to have Driver (ride ) / caregiver    for 24 hours after surgery - Driver- friend, Sue Ruiz / As of 07/21/2022, patient does not have someone to stay with her overnight. She is aware that she needs someone. I called and left Sue Ruiz at [facility] a message making her aware of this. Patient Special Instructions -----none Pre-Op special Instructions -----none Patient verbalized understanding of instructions that were given at this phone interview. Patient denies chest pain, fever, cough at this phone interview. Patient states that she does have some SOB with activity. She states this is her baseline for the last 5 years. She follows w/ PCP, Dr. Elesa Massed.  FYI: Patient states that she has holes in some of her teeth. She also states she has loose teeth in the lower front.   FYI: Patient did have a flag in her chart stating that she refused blood products. Per our phone conversation on 07/21/22, she stated that she would accept blood products. She asked me to remove this flag from her chart.

## 2022-07-28 NOTE — Progress Notes (Addendum)
Patient called in to clarify when she had to stop drinking liquids before surgery. I told her to stop by 0800 on the morning of surgery. She also stated that she has been sick for the last 2 weeks with a runny nose and cough. She stated that she was still sick. I instructed her to call Dr. Michel Bickers office and let them know. Patient stated that she would.  I called and left a message with Selita at [facility] on 07/28/22. I let her know the patient was sick and should be calling their office.

## 2022-07-29 ENCOUNTER — Other Ambulatory Visit: Payer: Self-pay | Admitting: Adult Health

## 2022-07-29 ENCOUNTER — Ambulatory Visit
Admission: RE | Admit: 2022-07-29 | Discharge: 2022-07-29 | Disposition: A | Discharge status: Home / Self Care | Payer: Medicare HMO | Source: Ambulatory Visit | Attending: Adult Health | Admitting: Adult Health

## 2022-07-29 DIAGNOSIS — R058 Other specified cough: Secondary | ICD-10-CM

## 2022-07-29 DIAGNOSIS — R053 Chronic cough: Secondary | ICD-10-CM | POA: Insufficient documentation

## 2022-08-04 ENCOUNTER — Ambulatory Visit (HOSPITAL_BASED_OUTPATIENT_CLINIC_OR_DEPARTMENT_OTHER): Admission: RE | Admit: 2022-08-04 | Payer: Medicare HMO | Source: Home / Self Care | Admitting: Urology

## 2022-08-04 DIAGNOSIS — Z01818 Encounter for other preprocedural examination: Secondary | ICD-10-CM

## 2022-08-04 HISTORY — DX: Presence of spectacles and contact lenses: Z97.3

## 2022-08-04 HISTORY — DX: Gastro-esophageal reflux disease without esophagitis: K21.9

## 2022-08-04 HISTORY — DX: Barrett's esophagus without dysplasia: K22.70

## 2022-08-04 HISTORY — DX: Family history of other specified conditions: Z84.89

## 2022-08-04 SURGERY — TURBT (TRANSURETHRAL RESECTION OF BLADDER TUMOR)
Anesthesia: General

## 2022-09-02 ENCOUNTER — Other Ambulatory Visit: Payer: Self-pay | Admitting: Urology

## 2022-09-14 ENCOUNTER — Other Ambulatory Visit: Payer: Self-pay | Admitting: Urology

## 2022-09-22 ENCOUNTER — Encounter (HOSPITAL_BASED_OUTPATIENT_CLINIC_OR_DEPARTMENT_OTHER): Payer: Self-pay

## 2022-09-22 ENCOUNTER — Ambulatory Visit (HOSPITAL_BASED_OUTPATIENT_CLINIC_OR_DEPARTMENT_OTHER): Admit: 2022-09-22 | Payer: Medicare HMO | Admitting: Urology

## 2022-09-22 SURGERY — TURBT (TRANSURETHRAL RESECTION OF BLADDER TUMOR)
Anesthesia: General

## 2022-09-24 ENCOUNTER — Encounter (HOSPITAL_BASED_OUTPATIENT_CLINIC_OR_DEPARTMENT_OTHER): Payer: Self-pay | Admitting: Urology

## 2022-09-24 ENCOUNTER — Other Ambulatory Visit: Payer: Self-pay

## 2022-09-24 NOTE — Progress Notes (Signed)
Spoke w/ via phone for pre-op interview: patient  Lab needs dos: I-Stat Lab results: EKG 12/19/21 COVID test: patient states asymptomatic no test needed. Arrive at 1000 10/16/22 NPO after MN except clear liquids. Clear liquids from MN until 0900 Med rec completed. Medications to take morning of surgery: esomeprazole only Diabetic medication: none AM of sx Patient instructed no nail polish to be worn day of surgery. Patient instructed to bring photo id and insurance card day of surgery. Patient aware to have driver (ride ) / caregiver for 24 hours after surgery. Daughter to drive. Special Instructions: NA Patient verbalized understanding of instructions that were given at this phone interview. Patient denies shortness of breath, chest pain, fever, cough at this phone interview.

## 2022-10-09 NOTE — Patient Instructions (Signed)
DUE TO COVID-19 ONLY TWO VISITORS  (aged 75 and older)  ARE ALLOWED TO COME WITH YOU AND STAY IN THE WAITING ROOM ONLY DURING PRE OP AND PROCEDURE.   **NO VISITORS ARE ALLOWED IN THE SHORT STAY AREA OR RECOVERY ROOM!!**  IF YOU WILL BE ADMITTED INTO THE HOSPITAL YOU ARE ALLOWED ONLY FOUR SUPPORT PEOPLE DURING VISITATION HOURS ONLY (7 AM -8PM)   The support person(s) must pass our screening, gel in and out, and wear a mask at all times, including in the patient's room. Patients must also wear a mask when staff or their support person are in the room. Visitors GUEST BADGE MUST BE WORN VISIBLY  One adult visitor may remain with you overnight and MUST be in the room by 8 P.M.     Your procedure is scheduled on:    Report to [facility] Main Entrance    Report to admitting at : 9:45 AM   Call this number if you have problems the morning of surgery [phone]   Do not eat food :After Midnight.   After Midnight you may have the following liquids until: 9:00 AM DAY OF SURGERY  Water Black Coffee (sugar ok, NO MILK/CREAM OR CREAMERS)  Tea (sugar ok, NO MILK/CREAM OR CREAMERS) regular and decaf                             Plain Jell-O (NO RED)                                           Fruit ices (not with fruit pulp, NO RED)                                     Popsicles (NO RED)                                                                  Juice: apple, WHITE grape, WHITE cranberry Sports drinks like Gatorade (NO RED)               Oral Hygiene is also important to reduce your risk of infection.                                    Remember - BRUSH YOUR TEETH THE MORNING OF SURGERY WITH YOUR REGULAR TOOTHPASTE  DENTURES WILL BE REMOVED PRIOR TO SURGERY PLEASE DO NOT APPLY "Poly grip" OR ADHESIVES!!!   Do NOT smoke after Midnight   Take these medicines the morning of surgery with A SIP OF WATER: esomeprazole.Tylenol as needed. How to Manage Your Diabetes Before and After  Surgery  Why is it important to control my blood sugar before and after surgery? Improving blood sugar levels before and after surgery helps healing and can limit problems. A way of improving blood sugar control is eating a healthy diet by:  Eating less sugar and carbohydrates  Increasing activity/exercise  Talking with your doctor about reaching your  blood sugar goals High blood sugars (greater than 180 mg/dL) can raise your risk of infections and slow your recovery, so you will need to focus on controlling your diabetes during the weeks before surgery. Make sure that the doctor who takes care of your diabetes knows about your planned surgery including the date and location.  How do I manage my blood sugar before surgery? Check your blood sugar at least 4 times a day, starting 2 days before surgery, to make sure that the level is not too high or low. Check your blood sugar the morning of your surgery when you wake up and every 2 hours until you get to the Short Stay unit. If your blood sugar is less than 70 mg/dL, you will need to treat for low blood sugar: Do not take insulin. Treat a low blood sugar (less than 70 mg/dL) with  cup of clear juice (cranberry or apple), 4 glucose tablets, OR glucose gel. Recheck blood sugar in 15 minutes after treatment (to make sure it is greater than 70 mg/dL). If your blood sugar is not greater than 70 mg/dL on recheck, call [phone] for further instructions. Report your blood sugar to the short stay nurse when you get to Short Stay.  If you are admitted to the hospital after surgery: Your blood sugar will be checked by the staff and you will probably be given insulin after surgery (instead of oral diabetes medicines) to make sure you have good blood sugar levels. The goal for blood sugar control after surgery is 80-180 mg/dL.   WHAT DO I DO ABOUT MY DIABETES MEDICATION?     THE MORNING OF SURGERY, DO NOT TAKE ANY ORAL DIABETIC MEDICATIONS DAY OF  YOUR SURGERY                    You may not have any metal on your body including hair pins, jewelry, and body piercing             Do not wear make-up, lotions, powders, perfumes/cologne, or deodorant  Do not wear nail polish including gel and S&S, artificial/acrylic nails, or any other type of covering on natural nails including finger and toenails. If you have artificial nails, gel coating, etc. that needs to be removed by a nail salon please have this removed prior to surgery or surgery may need to be canceled/ delayed if the surgeon/ anesthesia feels like they are unable to be safely monitored.   Do not shave  48 hours prior to surgery.    Do not bring valuables to the hospital. [facility] IS NOT             RESPONSIBLE   FOR VALUABLES.   Contacts, glasses, or bridgework may not be worn into surgery.   Bring small overnight bag day of surgery.   DO NOT BRING YOUR HOME MEDICATIONS TO THE HOSPITAL. PHARMACY WILL DISPENSE MEDICATIONS LISTED ON YOUR MEDICATION LIST TO YOU DURING YOUR ADMISSION IN THE HOSPITAL!    Patients discharged on the day of surgery will not be allowed to drive home.  Someone NEEDS to stay with you for the first 24 hours after anesthesia.   Special Instructions: Bring a copy of your healthcare power of attorney and living will documents         the day of surgery if you haven't scanned them before.              Please read over the following fact sheets you  were given: IF YOU HAVE QUESTIONS ABOUT YOUR PRE-OP INSTRUCTIONS PLEASE CALL [phone]    [facility] - Preparing for Surgery Before surgery, you can play an important role.  Because skin is not sterile, your skin needs to be as free of germs as possible.  You can reduce the number of germs on your skin by washing with CHG (chlorahexidine gluconate) soap before surgery.  CHG is an antiseptic cleaner which kills germs and bonds with the skin to continue killing germs even after washing. Please DO NOT use if  you have an allergy to CHG or antibacterial soaps.  If your skin becomes reddened/irritated stop using the CHG and inform your nurse when you arrive at Short Stay. Do not shave (including legs and underarms) for at least 48 hours prior to the first CHG shower.  You may shave your face/neck. Please follow these instructions carefully:  1.  Shower with CHG Soap the night before surgery and the  morning of Surgery.  2.  If you choose to wash your hair, wash your hair first as usual with your  normal  shampoo.  3.  After you shampoo, rinse your hair and body thoroughly to remove the  shampoo.                           4.  Use CHG as you would any other liquid soap.  You can apply chg directly  to the skin and wash                       Gently with a scrungie or clean washcloth.  5.  Apply the CHG Soap to your body ONLY FROM THE NECK DOWN.   Do not use on face/ open                           Wound or open sores. Avoid contact with eyes, ears mouth and genitals (private parts).                       Wash face,  Genitals (private parts) with your normal soap.             6.  Wash thoroughly, paying special attention to the area where your surgery  will be performed.  7.  Thoroughly rinse your body with warm water from the neck down.  8.  DO NOT shower/wash with your normal soap after using and rinsing off  the CHG Soap.                9.  Pat yourself dry with a clean towel.            10.  Wear clean pajamas.            11.  Place clean sheets on your bed the night of your first shower and do not  sleep with pets. Day of Surgery : Do not apply any lotions/deodorants the morning of surgery.  Please wear clean clothes to the hospital/surgery center.  FAILURE TO FOLLOW THESE INSTRUCTIONS MAY RESULT IN THE CANCELLATION OF YOUR SURGERY PATIENT SIGNATURE_________________________________  NURSE  SIGNATURE__________________________________  ________________________________________________________________________

## 2022-10-14 ENCOUNTER — Encounter (HOSPITAL_COMMUNITY)
Admission: RE | Admit: 2022-10-14 | Discharge: 2022-10-14 | Disposition: A | Discharge status: Home / Self Care | Payer: Medicare HMO | Source: Ambulatory Visit | Attending: Urology | Admitting: Urology

## 2022-10-14 ENCOUNTER — Encounter (HOSPITAL_COMMUNITY): Payer: Self-pay

## 2022-10-14 ENCOUNTER — Other Ambulatory Visit: Payer: Self-pay

## 2022-10-14 VITALS — BP 148/66 | HR 101 | Temp 97.5°F | Ht 60.0 in | Wt 156.0 lb

## 2022-10-14 DIAGNOSIS — E1159 Type 2 diabetes mellitus with other circulatory complications: Secondary | ICD-10-CM | POA: Diagnosis not present

## 2022-10-14 DIAGNOSIS — Z01812 Encounter for preprocedural laboratory examination: Secondary | ICD-10-CM | POA: Insufficient documentation

## 2022-10-14 DIAGNOSIS — E119 Type 2 diabetes mellitus without complications: Secondary | ICD-10-CM

## 2022-10-14 DIAGNOSIS — I152 Hypertension secondary to endocrine disorders: Secondary | ICD-10-CM | POA: Insufficient documentation

## 2022-10-14 HISTORY — DX: Hepatomegaly, not elsewhere classified: R16.0

## 2022-10-14 LAB — BASIC METABOLIC PANEL
Anion gap: 11 (ref 5–15)
BUN: 8 mg/dL (ref 8–23)
CO2: 25 mmol/L (ref 22–32)
Calcium: 9.1 mg/dL (ref 8.9–10.3)
Chloride: 103 mmol/L (ref 98–111)
Creatinine, Ser: 0.71 mg/dL (ref 0.44–1.00)
GFR, Estimated: >60 mL/min (ref >60)
Glucose, Bld: 155 mg/dL — ABNORMAL HIGH (ref 70–99)
Potassium: 3.1 mmol/L — ABNORMAL LOW (ref 3.5–5.1)
Sodium: 139 mmol/L (ref 135–145)

## 2022-10-14 LAB — CBC
HCT: 45.5 % (ref 36.0–46.0)
Hemoglobin: 14.4 g/dL (ref 12.0–15.0)
MCH: 28.6 pg (ref 26.0–34.0)
MCHC: 31.6 g/dL (ref 30.0–36.0)
MCV: 90.3 fL (ref 80.0–100.0)
Platelets: 292 10*3/uL (ref 150–400)
RBC: 5.04 MIL/uL (ref 3.87–5.11)
RDW: 13.7 % (ref 11.5–15.5)
WBC: 8.1 10*3/uL (ref 4.0–10.5)
nRBC: 0 % (ref 0.0–0.2)

## 2022-10-14 LAB — GLUCOSE, CAPILLARY: Glucose-Capillary: 193 mg/dL — ABNORMAL HIGH (ref 70–99)

## 2022-10-14 NOTE — Progress Notes (Addendum)
For Short Stay: COVID SWAB appointment date:  Bowel Prep reminder:   For Anesthesia: PCP - Theodis Shove, DO  Cardiologist - N/A  Chest x-ray - 07/30/22 EKG - 12/22/21 Stress Test -  ECHO -  Cardiac Cath -  Pacemaker/ICD device last checked: Pacemaker orders received: Device Rep notified:  Spinal Cord Stimulator: N/A  Sleep Study - N/A CPAP -   Fasting Blood Sugar - 120's Checks Blood Sugar __2___ times a day Date and result of last Hgb A1c- 8.0: 03/24/21  Last dose of GLP1 agonist- N/A GLP1 instructions:   Last dose of SGLT-2 inhibitors- N/A SGLT-2 instructions:   Blood Thinner Instructions:N/A Aspirin Instructions: Last Dose:  Activity level: Can go up a flight of stairs and activities of daily living without stopping and without chest pain and/or shortness of breath   Able to exercise without chest pain and/or shortness of breath   Unable to go up a flight of stairs shortness of breath  Anesthesia review: Hx: HTN,DIA,Loose teeth: 2 bottom front.  Patient denies shortness of breath, fever, cough and chest pain at PAT appointment   Patient verbalized understanding of instructions that were given to them at the PAT appointment. Patient was also instructed that they will need to review over the PAT instructions again at home before surgery.

## 2022-10-15 NOTE — Progress Notes (Signed)
Lab results: A1c: 8.1

## 2022-10-16 ENCOUNTER — Other Ambulatory Visit: Payer: Self-pay

## 2022-10-16 ENCOUNTER — Encounter (HOSPITAL_COMMUNITY): Payer: Self-pay | Admitting: Urology

## 2022-10-16 ENCOUNTER — Ambulatory Visit (HOSPITAL_COMMUNITY): Payer: Medicare HMO

## 2022-10-16 ENCOUNTER — Encounter (HOSPITAL_COMMUNITY)
Admission: RE | Disposition: A | Discharge status: Home / Self Care | Payer: Self-pay | Source: Home / Self Care | Attending: Urology

## 2022-10-16 ENCOUNTER — Ambulatory Visit (HOSPITAL_COMMUNITY)
Admission: RE | Admit: 2022-10-16 | Discharge: 2022-10-17 | Disposition: A | Discharge status: Home / Self Care | Payer: Medicare HMO | Attending: Urology | Admitting: Urology

## 2022-10-16 ENCOUNTER — Ambulatory Visit (HOSPITAL_BASED_OUTPATIENT_CLINIC_OR_DEPARTMENT_OTHER): Payer: Medicare HMO

## 2022-10-16 DIAGNOSIS — E119 Type 2 diabetes mellitus without complications: Secondary | ICD-10-CM

## 2022-10-16 DIAGNOSIS — I10 Essential (primary) hypertension: Secondary | ICD-10-CM | POA: Insufficient documentation

## 2022-10-16 DIAGNOSIS — N3289 Other specified disorders of bladder: Secondary | ICD-10-CM

## 2022-10-16 DIAGNOSIS — D494 Neoplasm of unspecified behavior of bladder: Secondary | ICD-10-CM

## 2022-10-16 DIAGNOSIS — Z01818 Encounter for other preprocedural examination: Secondary | ICD-10-CM

## 2022-10-16 DIAGNOSIS — Z7984 Long term (current) use of oral hypoglycemic drugs: Secondary | ICD-10-CM | POA: Diagnosis not present

## 2022-10-16 DIAGNOSIS — C672 Malignant neoplasm of lateral wall of bladder: Secondary | ICD-10-CM | POA: Diagnosis present

## 2022-10-16 DIAGNOSIS — Z87891 Personal history of nicotine dependence: Secondary | ICD-10-CM | POA: Diagnosis not present

## 2022-10-16 HISTORY — PX: CYSTOSCOPY: SHX5120

## 2022-10-16 HISTORY — PX: TRANSURETHRAL RESECTION OF BLADDER TUMOR WITH MITOMYCIN-C: SHX6459

## 2022-10-16 LAB — GLUCOSE, CAPILLARY
Glucose-Capillary: 130 mg/dL — ABNORMAL HIGH (ref 70–99)
Glucose-Capillary: 133 mg/dL — ABNORMAL HIGH (ref 70–99)
Glucose-Capillary: 140 mg/dL — ABNORMAL HIGH (ref 70–99)
Glucose-Capillary: 146 mg/dL — ABNORMAL HIGH (ref 70–99)
Glucose-Capillary: 195 mg/dL — ABNORMAL HIGH (ref 70–99)
Glucose-Capillary: 200 mg/dL — ABNORMAL HIGH (ref 70–99)

## 2022-10-16 LAB — POCT I-STAT, CHEM 8
BUN: 9 mg/dL (ref 8–23)
Calcium, Ion: 1.1 mmol/L — ABNORMAL LOW (ref 1.15–1.40)
Chloride: 103 mmol/L (ref 98–111)
Creatinine, Ser: 0.6 mg/dL (ref 0.44–1.00)
Glucose, Bld: 198 mg/dL — ABNORMAL HIGH (ref 70–99)
HCT: 42 % (ref 36.0–46.0)
Hemoglobin: 14.3 g/dL (ref 12.0–15.0)
Potassium: 3.2 mmol/L — ABNORMAL LOW (ref 3.5–5.1)
Sodium: 141 mmol/L (ref 135–145)
TCO2: 26 mmol/L (ref 22–32)

## 2022-10-16 SURGERY — TRANSURETHRAL RESECTION OF BLADDER TUMOR WITH MITOMYCIN-C
Anesthesia: General

## 2022-10-16 MED ORDER — ACETAMINOPHEN 325 MG PO TABS: 650.0000 mg | ORAL_TABLET | ORAL | Status: DC | PRN

## 2022-10-16 MED ORDER — FENTANYL CITRATE PF 50 MCG/ML IJ SOSY
25.0000 ug | PREFILLED_SYRINGE | INTRAMUSCULAR | Status: DC | PRN
  Administered 2022-10-16 (×2): 50 ug via INTRAVENOUS

## 2022-10-16 MED ORDER — AMISULPRIDE (ANTIEMETIC) 5 MG/2ML IV SOLN
INTRAVENOUS | Status: AC
  Filled 2022-10-16: qty 4

## 2022-10-16 MED ORDER — MIDAZOLAM HCL 2 MG/2ML IJ SOLN: 0.5000 mg | Freq: Once | INTRAMUSCULAR | Status: DC | PRN

## 2022-10-16 MED ORDER — HYDROCHLOROTHIAZIDE 25 MG PO TABS
25.0000 mg | ORAL_TABLET | Freq: Every day | ORAL | Status: DC
  Administered 2022-10-16 – 2022-10-17 (×2): 25 mg via ORAL
  Filled 2022-10-16 (×2): qty 1

## 2022-10-16 MED ORDER — PROPOFOL 10 MG/ML IV BOLUS
INTRAVENOUS | Status: AC
  Filled 2022-10-16: qty 20

## 2022-10-16 MED ORDER — ROCURONIUM BROMIDE 10 MG/ML (PF) SYRINGE
PREFILLED_SYRINGE | INTRAVENOUS | Status: AC
  Filled 2022-10-16: qty 10

## 2022-10-16 MED ORDER — SODIUM CHLORIDE 0.9% FLUSH: 3.0000 mL | INTRAVENOUS | Status: DC | PRN

## 2022-10-16 MED ORDER — SUGAMMADEX SODIUM 200 MG/2ML IV SOLN
INTRAVENOUS | Status: DC | PRN
Start: 2022-10-16 — End: 2022-10-16
  Administered 2022-10-16 (×2): 100 mg via INTRAVENOUS

## 2022-10-16 MED ORDER — SODIUM CHLORIDE 0.9 % IR SOLN
Status: DC | PRN
  Administered 2022-10-16 (×2): 6000 mL

## 2022-10-16 MED ORDER — PANTOPRAZOLE SODIUM 40 MG PO TBEC
40.0000 mg | DELAYED_RELEASE_TABLET | Freq: Every day | ORAL | Status: DC
  Administered 2022-10-17: 40 mg via ORAL
  Filled 2022-10-16 (×2): qty 1

## 2022-10-16 MED ORDER — OXYBUTYNIN CHLORIDE ER 5 MG PO TB24
10.0000 mg | ORAL_TABLET | Freq: Every day | ORAL | Status: DC
  Administered 2022-10-16 – 2022-10-17 (×2): 10 mg via ORAL
  Filled 2022-10-16: qty 1
  Filled 2022-10-16: qty 2

## 2022-10-16 MED ORDER — PROMETHAZINE HCL 25 MG/ML IJ SOLN: 6.2500 mg | INTRAMUSCULAR | Status: DC | PRN

## 2022-10-16 MED ORDER — ROCURONIUM BROMIDE 100 MG/10ML IV SOLN
INTRAVENOUS | Status: DC | PRN
  Administered 2022-10-16: 50 mg via INTRAVENOUS

## 2022-10-16 MED ORDER — PRAVASTATIN SODIUM 40 MG PO TABS
40.0000 mg | ORAL_TABLET | Freq: Every day | ORAL | Status: DC
  Administered 2022-10-16 – 2022-10-17 (×2): 40 mg via ORAL
  Filled 2022-10-16 (×2): qty 1

## 2022-10-16 MED ORDER — OXYCODONE-ACETAMINOPHEN 5-325 MG PO TABS: 1.0000 | ORAL_TABLET | ORAL | 0 refills | Status: DC | PRN

## 2022-10-16 MED ORDER — ONDANSETRON HCL 4 MG/2ML IJ SOLN
INTRAMUSCULAR | Status: AC
  Filled 2022-10-16: qty 2

## 2022-10-16 MED ORDER — ONDANSETRON HCL 4 MG/2ML IJ SOLN
4.0000 mg | INTRAMUSCULAR | Status: DC | PRN
  Administered 2022-10-16: 4 mg via INTRAVENOUS
  Filled 2022-10-16: qty 2

## 2022-10-16 MED ORDER — INSULIN ASPART 100 UNIT/ML IJ SOLN
0.0000 [IU] | INTRAMUSCULAR | Status: DC | PRN
  Administered 2022-10-16: 2 [IU] via SUBCUTANEOUS
  Filled 2022-10-16: qty 1

## 2022-10-16 MED ORDER — INSULIN ASPART 100 UNIT/ML IJ SOLN
0.0000 [IU] | INTRAMUSCULAR | Status: DC
  Administered 2022-10-16: 3 [IU] via SUBCUTANEOUS
  Administered 2022-10-16 – 2022-10-17 (×2): 2 [IU] via SUBCUTANEOUS
  Administered 2022-10-17: 3 [IU] via SUBCUTANEOUS

## 2022-10-16 MED ORDER — FENTANYL CITRATE (PF) 100 MCG/2ML IJ SOLN
INTRAMUSCULAR | Status: AC
  Filled 2022-10-16: qty 2

## 2022-10-16 MED ORDER — PROPOFOL 10 MG/ML IV BOLUS
INTRAVENOUS | Status: DC | PRN
  Administered 2022-10-16: 30 mg via INTRAVENOUS
  Administered 2022-10-16: 100 mg via INTRAVENOUS

## 2022-10-16 MED ORDER — HYDROMORPHONE HCL 1 MG/ML IJ SOLN: 0.5000 mg | INTRAMUSCULAR | Status: DC | PRN

## 2022-10-16 MED ORDER — SODIUM CHLORIDE 0.9 % IV SOLN: 250.0000 mL | INTRAVENOUS | Status: DC | PRN

## 2022-10-16 MED ORDER — INSULIN ASPART 100 UNIT/ML IJ SOLN
INTRAMUSCULAR | Status: AC
  Filled 2022-10-16: qty 1

## 2022-10-16 MED ORDER — CEFAZOLIN SODIUM-DEXTROSE 2-4 GM/100ML-% IV SOLN
2.0000 g | INTRAVENOUS | Status: AC
  Administered 2022-10-16 (×2): 2 g via INTRAVENOUS
  Filled 2022-10-16: qty 100

## 2022-10-16 MED ORDER — SODIUM CHLORIDE 0.9% FLUSH
3.0000 mL | Freq: Two times a day (BID) | INTRAVENOUS | Status: DC
  Administered 2022-10-16 (×2): 3 mL via INTRAVENOUS

## 2022-10-16 MED ORDER — ACETAMINOPHEN 500 MG PO TABS
1000.0000 mg | ORAL_TABLET | Freq: Once | ORAL | Status: AC
  Administered 2022-10-16: 1000 mg via ORAL
  Filled 2022-10-16: qty 2

## 2022-10-16 MED ORDER — LACTATED RINGERS IV SOLN: INTRAVENOUS | Status: DC

## 2022-10-16 MED ORDER — OXYCODONE HCL 5 MG PO TABS: 5.0000 mg | ORAL_TABLET | Freq: Once | ORAL | Status: DC | PRN

## 2022-10-16 MED ORDER — LIDOCAINE HCL 2 % IJ SOLN
INTRAMUSCULAR | Status: AC
  Filled 2022-10-16: qty 20

## 2022-10-16 MED ORDER — FENTANYL CITRATE PF 50 MCG/ML IJ SOSY
PREFILLED_SYRINGE | INTRAMUSCULAR | Status: AC
  Filled 2022-10-16: qty 2

## 2022-10-16 MED ORDER — ONDANSETRON HCL 4 MG/2ML IJ SOLN
INTRAMUSCULAR | Status: DC | PRN
  Administered 2022-10-16: 4 mg via INTRAVENOUS

## 2022-10-16 MED ORDER — GEMCITABINE CHEMO FOR BLADDER INSTILLATION 2000 MG
2000.0000 mg | Freq: Once | INTRAVENOUS | Status: AC
  Administered 2022-10-16: 2000 mg via INTRAVESICAL
  Filled 2022-10-16: qty 2000

## 2022-10-16 MED ORDER — POLYETHYLENE GLYCOL 3350 17 G PO PACK: 17.0000 g | PACK | Freq: Every day | ORAL | Status: DC | PRN

## 2022-10-16 MED ORDER — SODIUM CHLORIDE 0.45 % IV SOLN: INTRAVENOUS | Status: DC

## 2022-10-16 MED ORDER — OXYCODONE HCL 5 MG/5ML PO SOLN: 5.0000 mg | Freq: Once | ORAL | Status: DC | PRN

## 2022-10-16 MED ORDER — FENTANYL CITRATE (PF) 100 MCG/2ML IJ SOLN
INTRAMUSCULAR | Status: DC | PRN
  Administered 2022-10-16 (×2): 25 ug via INTRAVENOUS

## 2022-10-16 MED ORDER — AMISULPRIDE (ANTIEMETIC) 5 MG/2ML IV SOLN
10.0000 mg | Freq: Once | INTRAVENOUS | Status: AC
  Administered 2022-10-16: 10 mg via INTRAVENOUS

## 2022-10-16 MED ORDER — DOCUSATE SODIUM 100 MG PO CAPS: 100.0000 mg | ORAL_CAPSULE | Freq: Every day | ORAL | 0 refills | Status: AC | PRN

## 2022-10-16 MED ORDER — 0.9 % SODIUM CHLORIDE (POUR BTL) OPTIME
TOPICAL | Status: DC | PRN
Start: 2022-10-16 — End: 2022-10-16
  Administered 2022-10-16: 1000 mL

## 2022-10-16 MED ORDER — DIPHENHYDRAMINE HCL 25 MG PO CAPS: 25.0000 mg | ORAL_CAPSULE | Freq: Four times a day (QID) | ORAL | Status: DC | PRN

## 2022-10-16 MED ORDER — OXYCODONE-ACETAMINOPHEN 5-325 MG PO TABS: 1.0000 | ORAL_TABLET | ORAL | Status: DC | PRN

## 2022-10-16 MED ORDER — LIDOCAINE HCL (CARDIAC) PF 100 MG/5ML IV SOSY
PREFILLED_SYRINGE | INTRAVENOUS | Status: DC | PRN
  Administered 2022-10-16: 40 mg via INTRAVENOUS

## 2022-10-16 SURGICAL SUPPLY — 27 items
BAG DRN RND TRDRP ANRFLXCHMBR (UROLOGICAL SUPPLIES)
BAG URINE DRAIN 2000ML AR STRL (UROLOGICAL SUPPLIES) IMPLANT
BAG URO CATCHER STRL LF (MISCELLANEOUS) ×2 IMPLANT
CATH URETL OPEN 5X70 (CATHETERS) IMPLANT
CLOTH BEACON ORANGE TIMEOUT ST (SAFETY) ×2 IMPLANT
DRAPE FOOT SWITCH (DRAPES) ×2 IMPLANT
ELECT REM PT RETURN 15FT ADLT (MISCELLANEOUS) ×2 IMPLANT
EVACUATOR MICROVAS BLADDER (UROLOGICAL SUPPLIES) IMPLANT
GLOVE BIOGEL M 7.0 STRL (GLOVE) ×2 IMPLANT
GLOVE SURG LX STRL 7.5 STRW (GLOVE) ×2 IMPLANT
GOWN STRL REUS W/ TWL LRG LVL3 (GOWN DISPOSABLE) ×2 IMPLANT
GOWN STRL REUS W/ TWL XL LVL3 (GOWN DISPOSABLE) ×2 IMPLANT
GOWN STRL REUS W/TWL LRG LVL3 (GOWN DISPOSABLE) ×1
GOWN STRL REUS W/TWL XL LVL3 (GOWN DISPOSABLE) ×1
GUIDEWIRE STR DUAL SENSOR (WIRE) ×2 IMPLANT
GUIDEWIRE ZIPWRE .038 STRAIGHT (WIRE) IMPLANT
HOLDER FOLEY CATH W/STRAP (MISCELLANEOUS) IMPLANT
KIT TURNOVER KIT A (KITS) IMPLANT
LOOP CUT BIPOLAR 24F LRG (ELECTROSURGICAL) IMPLANT
MANIFOLD NEPTUNE II (INSTRUMENTS) ×2 IMPLANT
PACK CYSTO (CUSTOM PROCEDURE TRAY) ×2 IMPLANT
SHEATH DILATOR SET 8/10 (MISCELLANEOUS) IMPLANT
SYR 10ML LL (SYRINGE) ×2 IMPLANT
SYR TOOMEY IRRIG 70ML (MISCELLANEOUS)
SYRINGE TOOMEY IRRIG 70ML (MISCELLANEOUS) IMPLANT
TUBING CONNECTING 10 (TUBING) ×2 IMPLANT
TUBING UROLOGY SET (TUBING) ×2 IMPLANT

## 2022-10-16 NOTE — Discharge Instructions (Signed)

## 2022-10-16 NOTE — Anesthesia Postprocedure Evaluation (Signed)
Anesthesia Post Note  Patient: Sue Ruiz  Procedure(s) Performed: TRANSURETHRAL RESECTION OF BLADDER TUMOR WITH INSTILLATION OF GEMCITABINE CYSTOSCOPY     Patient location during evaluation: PACU Anesthesia Type: General Level of consciousness: awake and alert, patient cooperative and oriented Pain management: pain level controlled (pain improving, pt says she feels better) Vital Signs Assessment: post-procedure vital signs reviewed and stable Respiratory status: spontaneous breathing, nonlabored ventilation and respiratory function stable Cardiovascular status: blood pressure returned to baseline and stable Postop Assessment: no apparent nausea or vomiting (nausea relieved) Anesthetic complications: no   No notable events documented.  Last Vitals:  Vitals:   10/16/22 1445 10/16/22 1500  BP: 135/61 (!) 128/57  Pulse: 81 90  Resp: 13 20  Temp:    SpO2: 94% 90%    Last Pain:  Vitals:   10/16/22 1500  TempSrc:   PainSc: 9                  ,E. 

## 2022-10-16 NOTE — Anesthesia Preprocedure Evaluation (Addendum)
Anesthesia Evaluation  Patient identified by MRN, date of birth, ID band Patient awake    Reviewed: Allergy & Precautions, NPO status , Patient's Chart, lab work & pertinent test results  History of Anesthesia Complications Negative for: history of anesthetic complications  Airway Mallampati: II  TM Distance: >3 FB Neck ROM: Full    Dental  (+) Missing, Poor Dentition, Loose, Chipped, Dental Advisory Given   Pulmonary former smoker   breath sounds clear to auscultation       Cardiovascular hypertension, Pt. on medications (-) angina  Rhythm:Regular Rate:Normal     Neuro/Psych negative neurological ROS     GI/Hepatic Neg liver ROS,GERD  Medicated and Controlled,,  Endo/Other  diabetes (glu 195), Oral Hypoglycemic Agents    Renal/GU negative Renal ROS   Bladder tumor    Musculoskeletal  (+)  Fibromyalgia -  Abdominal   Peds  Hematology   Anesthesia Other Findings   Reproductive/Obstetrics                              Anesthesia Physical Anesthesia Plan  ASA: 2  Anesthesia Plan: General   Post-op Pain Management: Tylenol PO (pre-op)*   Induction: Intravenous  PONV Risk Score and Plan: 3 and Ondansetron, Dexamethasone and Treatment may vary due to age or medical condition  Airway Management Planned: Oral ETT  Additional Equipment: None  Intra-op Plan:   Post-operative Plan: Extubation in OR  Informed Consent: I have reviewed the patients History and Physical, chart, labs and discussed the procedure including the risks, benefits and alternatives for the proposed anesthesia with the patient or authorized representative who has indicated his/her understanding and acceptance.     Dental advisory given  Plan Discussed with: CRNA and Surgeon  Anesthesia Plan Comments:          Anesthesia Quick Evaluation

## 2022-10-16 NOTE — Anesthesia Procedure Notes (Signed)
Procedure Name: Intubation Date/Time: 10/16/2022 12:30 PM  Performed by: Maurene Capes, CRNAPre-anesthesia Checklist: Patient identified, Emergency Drugs available, Suction available and Patient being monitored Patient Re-evaluated:Patient Re-evaluated prior to induction Oxygen Delivery Method: Circle System Utilized Preoxygenation: Pre-oxygenation with 100% oxygen Induction Type: IV induction Ventilation: Mask ventilation without difficulty Laryngoscope Size: Mac and 4 Grade View: Grade III Tube type: Oral Tube size: 7.0 mm Number of attempts: 1 Airway Equipment and Method: Stylet Placement Confirmation: ETT inserted through vocal cords under direct vision, positive ETCO2 and breath sounds checked- equal and bilateral Secured at: 20 cm Tube secured with: Tape Dental Injury: Teeth and Oropharynx as per pre-operative assessment

## 2022-10-16 NOTE — H&P (Addendum)
Urology Preoperative H&P   Chief Complaint: Bladder mass  History of Present Illness: Sue Ruiz is a 77 y.o. female with bladder mass here for TURBT and gemcitabine. Denies fevers, chills, dysuria.    Past Medical History:  Diagnosis Date   Anemia 2020   hx of iron infusions   Barrett's esophagus    Chest pain 12/19/2021   Per patient, she was seen in the ED and told her pain was due to acid reflux.   Diabetes mellitus without complication [facility])    Patient follows w/ PCP, Dr. Elesa Massed .   Enlarged liver    Family history of adverse reaction to anesthesia    Patient states that her oldest daughter is difficult to wake up from anesthesia.   Fibromyalgia    Follows w/ PCP, Dr. Elesa Massed.   GERD (gastroesophageal reflux disease)    Takes Nexium daily.   High cholesterol    Hypertension    Patient follows w/ PCP, Dr. Elesa Massed.   IBS (irritable bowel syndrome)    Loose, teeth 2024   Patient states that she has several loose teeth on the front bottom.   Vertigo    Wears glasses     Past Surgical History:  Procedure Laterality Date   COLONOSCOPY  11/14/2018   ESOPHAGOGASTRODUODENOSCOPY  10/15/2021   TOTAL VAGINAL HYSTERECTOMY  2013   with BSO - confirmed by op report and pathology. Done for prolapse    Allergies:  Allergies  Allergen Reactions   Codeine Swelling    Tongue gets swollen and a rash   Asa [Aspirin] Rash    Some swelling in lips.   Insulin Glargine Hives   Losartan Other (See Comments)    Swelling in fingers    Family History  Problem Relation Age of Onset   Diabetes Mother    Hypertension Mother    Diabetes Father    Hypertension Father    Diabetes Brother    Hypertension Brother     Social History:  reports that she has quit smoking. Her smoking use included cigarettes. She has never used smokeless tobacco. She reports that she does not currently use alcohol. She reports that she does not use drugs.  ROS: A complete review  of systems was performed.  All systems are negative except for pertinent findings as noted.  Physical Exam:  Vital signs in last 24 hours: Temp:  [98 F (36.7 C)] 98 F (36.7 C) (08/02 0921) Pulse Rate:  [82] 82 (08/02 0921) Resp:  [18] 18 (08/02 0921) BP: (142)/(61) 142/61 (08/02 0921) SpO2:  [97 %] 97 % (08/02 0921) Weight:  [70.8 kg] 70.8 kg (08/02 0928) Constitutional:  Alert and oriented, No acute distress Cardiovascular: Regular rate and rhythm Respiratory: Normal respiratory effort, Lungs clear bilaterally GI: Abdomen is soft, nontender, nondistended, no abdominal masses GU: No CVA tenderness Lymphatic: No lymphadenopathy Neurologic: Grossly intact, no focal deficits Psychiatric: Normal mood and affect  Laboratory Data:  Recent Labs    10/14/22 1150 10/16/22 0952  WBC 8.1  --   HGB 14.4 14.3  HCT 45.5 42.0  PLT 292  --     Recent Labs    10/14/22 1150 10/16/22 0952  NA 139 141  K 3.1* 3.2*  CL 103 103  GLUCOSE 155* 198*  BUN 8 9  CALCIUM 9.1  --   CREATININE 0.71 0.60     Results for orders placed or performed during the hospital encounter of 10/16/22 (from the past 24 hour(s))  Glucose, capillary     Status: Abnormal   Collection Time: 10/16/22  9:25 AM  Result Value Ref Range   Glucose-Capillary 195 (H) 70 - 99 mg/dL   Comment 1 Notify RN    Comment 2 Document in Chart   I-STAT, chem 8     Status: Abnormal   Collection Time: 10/16/22  9:52 AM  Result Value Ref Range   Sodium 141 135 - 145 mmol/L   Potassium 3.2 (L) 3.5 - 5.1 mmol/L   Chloride 103 98 - 111 mmol/L   BUN 9 8 - 23 mg/dL   Creatinine, Ser 4.01 0.44 - 1.00 mg/dL   Glucose, Bld 027 (H) 70 - 99 mg/dL   Calcium, Ion 2.53 (L) 1.15 - 1.40 mmol/L   TCO2 26 22 - 32 mmol/L   Hemoglobin 14.3 12.0 - 15.0 g/dL   HCT 66.4 40.3 - 47.4 %   No results found for this or any previous visit (from the past 240 hour(s)).  Renal Function: Recent Labs    10/14/22 1150 10/16/22 0952  CREATININE  0.71 0.60   Estimated Creatinine Clearance: 54.2 mL/min (by C-G formula based on SCr of 0.6 mg/dL).  Radiologic Imaging: No results found.  I independently reviewed the above imaging studies.  Assessment and Plan Sue Ruiz is a 77 y.o. female with a bladder mass here for TURBT and instillation of gemcitabine.  Risks and benefits of Transurethral Resection of Bladder Tumor were reviewed in detail including infection, bleeding, blood transfusion, injury to bladder/urethra/surrounding structures, bladder perforation, obstructive and irritative voiding symptoms, and global anesthesia risks including but not limited to CVA, MI, DVT, PE, pneumonia, and death. Patient expressed understanding and desire to proceed.    Matt R.  MD 10/16/2022, 11:53 AM  [facility] Pager: [phone]): [phone]

## 2022-10-16 NOTE — Plan of Care (Signed)

## 2022-10-16 NOTE — Transfer of Care (Signed)
Immediate Anesthesia Transfer of Care Note  Patient: Sue Ruiz  Procedure(s) Performed: TRANSURETHRAL RESECTION OF BLADDER TUMOR WITH INSTILLATION OF GEMCITABINE CYSTOSCOPY  Patient Location: PACU  Anesthesia Type:General  Level of Consciousness: awake, alert , oriented, and patient cooperative  Airway & Oxygen Therapy: Patient Spontanous Breathing and Patient connected to nasal cannula oxygen  Post-op Assessment: Report given to RN and Post -op Vital signs reviewed and stable  Post vital signs: Reviewed and stable  Last Vitals:  Vitals Value Taken Time  BP 149/78 10/16/22 1323  Temp    Pulse 93 10/16/22 1327  Resp 20 10/16/22 1327  SpO2 92 % 10/16/22 1327  Vitals shown include unfiled device data.  Last Pain:  Vitals:   10/16/22 0921  TempSrc: Oral         Complications: No notable events documented.

## 2022-10-16 NOTE — Op Note (Addendum)
Operative Note  Preoperative diagnosis:  1.  Bladder mass  Postoperative diagnosis: 1.  Bladder mass  Procedure(s): 1.  TURBT large  2. Instillation of gemcitabine  Surgeon: Jettie Pagan, MD  Assistants:  None  Anesthesia:  General  Complications:  None  EBL:  minimal  Specimens: 1.  ID Type Source Tests Collected by Time Destination  1 : Right lateral wall bladder tumor Tissue PATH GU tumor resection SURGICAL PATHOLOGY [name], MD 10/16/2022 1308    Drains/Catheters: 1.  16 Fr foley catheter  Intraoperative findings:   Large papillary bladder mass over right lateral wall of the bladder extending to the ureteral orifice however not involving it. Excellent resection with excellent hemostasis.  Number of tumors: 1 Size of largest tumor:          About 5 cm    Characteristics of tumors:     Papillary  Recurrent   No      Primary   Yes  Suspicious for Carcinoma in situ:    No  Clinical tumor stage:            cTa    Bimanual exam under anesthesia:        No evidence of 3D mass  Visually complete resection:                 Yes  Visualization of detrusor muscle in resection base:        Yes  Visual evaluation for perforation:             Performed, no evidence of perforation   Indication:  Sue Ruiz is a 77 y.o. female with a large right lateral wall bladder mass. All the risks, benefits were discussed with the patient to include but not limited to infection, pain, bleeding, damage to adjacent structures, need for further operations, adverse reaction to anesthesia and death.  Patient understands these risks and agrees to proceed with the operation as planned.    Description of procedure: After informed consent was obtained from the patient, the patient was taken to the operating room. General anesthesia was administered. The patient was placed in dorsal lithotomy position and prepped and draped in usual sterile fashion. Sequential compression devices were  applied to lower extremities at the beginning of the case for DVT prophylaxis. Antibiotics were infused prior to surgery start time. A surgical time-out was performed to properly identify the patient, the surgery to be performed, and the surgical site.     We then passed the 21-French rigid cystoscope down the urethra and into the bladder under direct vision without any difficulty. The anterior urethral was normal. The bladder was inspected with 30 and 70 degree lenses. Once in the bladder, systematic evaluation of bladder revealed a large right lateral wall bladder tumor about 5cm. The ureteral orfices were in orthotopic position and not involved.   We then removed the cystoscope and then passed down the 26 French resectoscope sheath down the urethra into the bladder under direct vision with the visual obturator. The tumor was resected down to muscle. The TUR bladder tumor chips were retrieved from the bladder and each region of resection was passed off the field as a separate specimen.  Hemostasis was achieved using electrocautery. We then proceeded with removing the resectoscope and then placed in a 16 Foley catheter.  The patient tolerated the procedure well with no complication and was awoken from anesthesia and taken to recovery in stable condition.     While  in the post-operative care unit, as a separate procedure, 2000 mg of gemcitabine in 50 mL of water was instilled in the bladder through the catheter and the catheter was plugged. This will remain indwelling for approximately one hour. It will then be drained from the bladder and the catheter will be removed and the patient discharged home.  Plan: observe overnight as she has no one to stay with her tonight. Will plan for discharge home tomorrow.  Matt R.  MD [facility]  Pager: [phone]

## 2022-10-17 ENCOUNTER — Encounter (HOSPITAL_COMMUNITY): Payer: Self-pay | Admitting: Urology

## 2022-10-17 DIAGNOSIS — C672 Malignant neoplasm of lateral wall of bladder: Secondary | ICD-10-CM | POA: Diagnosis not present

## 2022-10-17 LAB — GLUCOSE, CAPILLARY
Glucose-Capillary: 151 mg/dL — ABNORMAL HIGH (ref 70–99)
Glucose-Capillary: 148 mg/dL — ABNORMAL HIGH (ref 70–99)
Glucose-Capillary: 186 mg/dL — ABNORMAL HIGH (ref 70–99)

## 2022-10-17 NOTE — Plan of Care (Signed)

## 2022-10-17 NOTE — Progress Notes (Signed)
Urology Inpatient Progress Note  Subjective: Patient doing well this am.  Only complaint is cath related discomfort. Wants to go home Anti-infectives: Anti-infectives (From admission, onward)    Start     Dose/Rate Route Frequency Ordered Stop   10/16/22 0928  ceFAZolin (ANCEF) IVPB 2g/100 mL premix        2 g 200 mL/hr over 30 Minutes Intravenous 30 min pre-op 10/16/22 1610 10/16/22 1225       Current Facility-Administered Medications  Medication Dose Route Frequency Provider Last Rate Last Admin   0.45 % sodium chloride infusion   Intravenous Continuous [name], MD 50 mL/hr at 10/17/22 9604 Infusion Verify at 10/17/22 0611   0.9 %  sodium chloride infusion  250 mL Intravenous PRN [name], MD       acetaminophen (TYLENOL) tablet 650 mg  650 mg Oral Q4H PRN [name], MD       diphenhydrAMINE (BENADRYL) capsule 25 mg  25 mg Oral Q6H PRN [name], MD       hydrochlorothiazide (HYDRODIURIL) tablet 25 mg  25 mg Oral Daily [name], MD   25 mg at 10/17/22 5409   HYDROmorphone (DILAUDID) injection 0.5-1 mg  0.5-1 mg Intravenous Q2H PRN [name], MD       insulin aspart (novoLOG) injection 0-15 Units  0-15 Units Subcutaneous Q4H [name], MD   2 Units at 10/17/22 0850   ondansetron [facility]) injection 4 mg  4 mg Intravenous Q4H PRN [name], MD   4 mg at 10/16/22 1907   oxybutynin (DITROPAN-XL) 24 hr tablet 10 mg  10 mg Oral Daily [name], MD   10 mg at 10/17/22 8119   oxyCODONE-acetaminophen (PERCOCET/ROXICET) 5-325 MG per tablet 1-2 tablet  1-2 tablet Oral Q4H PRN [name], MD       pantoprazole (PROTONIX) EC tablet 40 mg  40 mg Oral Daily [name], MD   40 mg at 10/17/22 0941   polyethylene glycol (MIRALAX / GLYCOLAX) packet 17 g  17 g Oral Daily PRN [name], MD       pravastatin (PRAVACHOL) tablet 40 mg  40 mg Oral Daily [name], MD   40 mg at 10/17/22 1478   sodium chloride flush (NS) 0.9 % injection 3 mL   3 mL Intravenous Q12H [name], MD   3 mL at 10/16/22 2219   sodium chloride flush (NS) 0.9 % injection 3 mL  3 mL Intravenous PRN [name], MD         Objective: Vital signs in last 24 hours: Temp:  [97.6 F (36.4 C)-99.3 F (37.4 C)] 98.5 F (36.9 C) (08/03 0605) Pulse Rate:  [71-96] 88 (08/03 0605) Resp:  [13-22] 19 (08/03 0605) BP: (107-149)/(52-82) 121/54 (08/03 0605) SpO2:  [90 %-100 %] 97 % (08/03 0605)  Intake/Output from previous day: 08/02 0701 - 08/03 0700 In: 1724.9 [P.O.:120; I.V.:1504.9; IV Piggyback:100] Out: 2050 [Urine:2050] Intake/Output this shift: Total I/O In: -  Out: 250 [Urine:250]  GENERAL APPEARANCE:  Well appearing, well developed, well nourished, NAD HEENT:  Atraumatic, normocephalic, oropharynx clear NECK:  Supple without lymphadenopathy or thyromegaly ABDOMEN:  Soft, non-tender, no masses EXTREMITIES:  Moves all extremities well, without clubbing, cyanosis, or edema NEUROLOGIC:  Alert and oriented x 3, normal gait, CN II-XII grossly intact MENTAL STATUS:  appropriate BACK:  Non-tender to palpation, No CVAT SKIN:  Warm, dry, and intact  Lab Results:  Recent Labs    10/14/22 1150 10/16/22 0952  WBC 8.1  --   HGB 14.4 14.3  HCT 45.5 42.0  PLT 292  --    BMET Recent Labs    10/14/22 1150 10/16/22 0952  NA 139 141  K 3.1* 3.2*  CL 103 103  CO2 25  --   GLUCOSE 155* 198*  BUN 8 9  CREATININE 0.71 0.60  CALCIUM 9.1  --    PT/INR No results for input(s): "LABPROT", "INR" in the last 72 hours. ABG No results for input(s): "PHART", "HCO3" in the last 72 hours.  Invalid input(s): "PCO2", "PO2"  Studies/Results: No results found.   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

## 2022-10-21 DIAGNOSIS — R21 Rash and other nonspecific skin eruption: Secondary | ICD-10-CM | POA: Insufficient documentation

## 2022-10-27 ENCOUNTER — Other Ambulatory Visit: Payer: Self-pay | Admitting: Urology

## 2022-11-08 NOTE — Progress Notes (Signed)
COVID Vaccine received:  [x]  No []  Yes Date of any COVID positive Test in last 90 days:  None  PCP - Revonda Standard B. Combs, DO  Cardiologist - none  Chest x-ray - 07-30-2022  2v  Epic EKG -  12-22-2021  Epic Stress Test -  ECHO -  Cardiac Cath -   PCR screen: []  Ordered & Completed           []   No Order but Needs PROFEND           [x]   N/A for this surgery  Surgery Plan:  [x]  Ambulatory   []  Outpatient in bed  []  Admit  Anesthesia:    [x]  General  []  Spinal  []   Choice []   MAC  Bowel Prep - [x]  No  []   Yes ______  Pacemaker / ICD device [x]  No []  Yes   Spinal Cord Stimulator:[x]  No []  Yes       History of Sleep Apnea? [x]  No []  Yes   CPAP used?- [x]  No []  Yes    Does the patient monitor blood sugar?  []  No [x]  Yes  []  N/A  Patient has: []  NO Hx DM   []  Pre-DM   []  DM1  [x]   DM2 Last A1c was: 8.1   on    10-14-2022  Does patient have a [employer] or Dexacom? []  No []  Yes   Fasting Blood Sugar Ranges- 150-290  just changed Glipizide dose  4 days ago Checks Blood Sugar _2____ times a day  Diabetic medications/ instructions:  Metformin 1000mg  bid, Glipizide 10mg  XL   1 tab q am  Blood Thinner / Instructions:  none Aspirin Instructions:  none  ERAS Protocol Ordered: [x]  No  []  Yes Patient is to be NPO after: Midnight prior   Activity level: Patient is able to climb a flight of stairs without difficulty; [x]  No CP   but would have SOB. Patient can  perform ADLs without assistance.   Anesthesia review: HTN, DM2, GERD, has loose teeth in front bottom, anemia, enlarged liver.   ALLERGIC TO CHG will use antibacterial soap for preop showers.  Patient denies shortness of breath, fever, cough and chest pain at PAT appointment.  Patient verbalized understanding and agreement to the Pre-Surgical Instructions that were given to them at this PAT appointment. Patient was also educated of the need to review these PAT instructions again prior to her surgery.I reviewed the appropriate  phone numbers to call if they have any and questions or concerns.

## 2022-11-08 NOTE — Patient Instructions (Addendum)
SURGICAL WAITING ROOM VISITATION Patients having surgery or a procedure may have no more than 2 support people in the waiting area - these visitors may rotate in the visitor waiting room.   Due to an increase in RSV and influenza rates and associated hospitalizations, children ages 22 and under may not visit patients in [facility] hospitals. If the patient needs to stay at the hospital during part of their recovery, the visitor guidelines for inpatient rooms apply.  PRE-OP VISITATION  Pre-op nurse will coordinate an appropriate time for 1 support person to accompany the patient in pre-op.  This support person may not rotate.  This visitor will be contacted when the time is appropriate for the visitor to come back in the pre-op area.  Please refer to the [facility] website for the visitor guidelines for Inpatients (after your surgery is over and you are in a regular room).  You are not required to quarantine at this time prior to your surgery. However, you must do this: Hand Hygiene often Do NOT share personal items Notify your provider if you are in close contact with someone who has COVID or you develop fever 100.4 or greater, new onset of sneezing, cough, sore throat, shortness of breath or body aches.  If you test positive for Covid or have been in contact with anyone that has tested positive in the last 10 days please notify you surgeon.    Your procedure is scheduled on:  Friday  November 20, 2022  Report to [facility] Main Entrance: [place] entrance where the [employer] is available.   Report to admitting at:  11:30   AM  Call this number if you have any questions or problems the morning of surgery [phone]  DO NOT EAT OR DRINK ANYTHING AFTER MIDNIGHT THE NIGHT PRIOR TO YOUR SURGERY / PROCEDURE.   FOLLOW BOWEL PREP AND ANY ADDITIONAL PRE OP INSTRUCTIONS YOU RECEIVED FROM YOUR SURGEON'S OFFICE!!!   Oral Hygiene is also important to reduce your risk of infection.         Remember - BRUSH YOUR TEETH THE MORNING OF SURGERY WITH YOUR REGULAR TOOTHPASTE  Do NOT smoke after Midnight the night before surgery.  STOP TAKING all Vitamins, Herbs and supplements 1 week before your surgery.   DIABETIC MEDICATION:  Metformin 1000mg :   Take as usual the Day BEFORE surgery.  DO NOT TAKE Metformin the day of your surgery.  Glipizide 10mg  XL    5mg :   Take as usual the Day BEFORE surgery.  DO NOT TAKE Glipizide the day of your surgery.   Take ONLY these medicines the morning of surgery with A SIP OF WATER: esomeprazole (Nexium)                    You may not have any metal on your body including hair pins, jewelry, and body piercing  Do not wear make-up, lotions, powders, perfumes, or deodorant  Do not wear nail polish including gel and S&S, artificial / acrylic nails, or any other type of covering on natural nails including finger and toenails. If you have artificial nails, gel coating, etc., that needs to be removed by a nail salon, Please have this removed prior to surgery. Not doing so may mean that your surgery could be cancelled or delayed if the Surgeon or anesthesia staff feels like they are unable to monitor you safely.   Do not shave 48 hours prior to surgery to avoid nicks in your  skin which may contribute to postoperative infections.   Contacts, Hearing Aids, dentures or bridgework may not be worn into surgery. DENTURES WILL BE REMOVED PRIOR TO SURGERY PLEASE DO NOT APPLY "Poly grip" OR ADHESIVES!!!  Patients discharged on the day of surgery will not be allowed to drive home.  Someone NEEDS to stay with you for the first 24 hours after anesthesia.  Do not bring your home medications to the hospital. The Pharmacy will dispense medications listed on your medication list to you during your admission in the Hospital.  Please read over the following fact sheets you were given: IF YOU HAVE QUESTIONS ABOUT YOUR PRE-OP INSTRUCTIONS, PLEASE CALL  [phone].   [place] - Preparing for Surgery Before surgery, you can play an important role.  Because skin is not sterile, your skin needs to be as free of germs as possible.  You can reduce the number of germs on your skin by washing with CHG (chlorahexidine gluconate) soap before surgery.  CHG is an antiseptic cleaner which kills germs and bonds with the skin to continue killing germs even after washing. Please DO NOT use if you have an allergy to CHG or antibacterial soaps.  If your skin becomes reddened/irritated stop using the CHG and inform your nurse when you arrive at Short Stay. Do not shave (including legs and underarms) for at least 48 hours prior to the first CHG shower.  You may shave your face/neck.  Please follow these instructions carefully:  1.  Shower with CHG Soap the night before surgery and the  morning of surgery.  2.  If you choose to wash your hair, wash your hair first as usual with your normal  shampoo.  3.  After you shampoo, rinse your hair and body thoroughly to remove the shampoo.                             4.  Use CHG as you would any other liquid soap.  You can apply chg directly to the skin and wash.  Gently with a scrungie or clean washcloth.  5.  Apply the CHG Soap to your body ONLY FROM THE NECK DOWN.   Do not use on face/ open                           Wound or open sores. Avoid contact with eyes, ears mouth and genitals (private parts).                       Wash face,  Genitals (private parts) with your normal soap.             6.  Wash thoroughly, paying special attention to the area where your  surgery  will be performed.  7.  Thoroughly rinse your body with warm water from the neck down.  8.  DO NOT shower/wash with your normal soap after using and rinsing off the CHG Soap.            9.  Pat yourself dry with a clean towel.            10.  Wear clean pajamas.            11.  Place clean sheets on your bed the night of your first shower and do  not  sleep with pets.  ON THE DAY OF SURGERY : Do  not apply any lotions/deodorants the morning of surgery.  Please wear clean clothes to the hospital/surgery center.    FAILURE TO FOLLOW THESE INSTRUCTIONS MAY RESULT IN THE CANCELLATION OF YOUR SURGERY  PATIENT SIGNATURE_________________________________  NURSE SIGNATURE__________________________________  ________________________________________________________________________

## 2022-11-11 ENCOUNTER — Encounter (HOSPITAL_COMMUNITY)
Admission: RE | Admit: 2022-11-11 | Discharge: 2022-11-11 | Disposition: A | Discharge status: Home / Self Care | Payer: Medicare HMO | Source: Ambulatory Visit | Attending: Urology | Admitting: Urology

## 2022-11-11 ENCOUNTER — Other Ambulatory Visit: Payer: Self-pay

## 2022-11-11 ENCOUNTER — Encounter (HOSPITAL_COMMUNITY): Payer: Self-pay

## 2022-11-11 VITALS — BP 134/63 | HR 88 | Temp 98.0°F | Resp 18 | Ht 60.0 in | Wt 151.0 lb

## 2022-11-11 DIAGNOSIS — E119 Type 2 diabetes mellitus without complications: Secondary | ICD-10-CM | POA: Diagnosis not present

## 2022-11-11 DIAGNOSIS — Z01818 Encounter for other preprocedural examination: Secondary | ICD-10-CM | POA: Diagnosis present

## 2022-11-11 DIAGNOSIS — Z01812 Encounter for preprocedural laboratory examination: Secondary | ICD-10-CM | POA: Insufficient documentation

## 2022-11-11 DIAGNOSIS — R160 Hepatomegaly, not elsewhere classified: Secondary | ICD-10-CM | POA: Diagnosis not present

## 2022-11-11 LAB — COMPREHENSIVE METABOLIC PANEL
ALT: 34 U/L (ref 0–44)
AST: 26 U/L (ref 15–41)
Albumin: 4.2 g/dL (ref 3.5–5.0)
Alkaline Phosphatase: 66 U/L (ref 38–126)
Anion gap: 11 (ref 5–15)
BUN: 9 mg/dL (ref 8–23)
CO2: 27 mmol/L (ref 22–32)
Calcium: 9.2 mg/dL (ref 8.9–10.3)
Chloride: 97 mmol/L — ABNORMAL LOW (ref 98–111)
Creatinine, Ser: 0.56 mg/dL (ref 0.44–1.00)
GFR, Estimated: >60 mL/min (ref >60)
Glucose, Bld: 246 mg/dL — ABNORMAL HIGH (ref 70–99)
Potassium: 4.2 mmol/L (ref 3.5–5.1)
Sodium: 135 mmol/L (ref 135–145)
Total Bilirubin: 0.5 mg/dL (ref 0.3–1.2)
Total Protein: 7.5 g/dL (ref 6.5–8.1)

## 2022-11-11 LAB — CBC
HCT: 44.3 % (ref 36.0–46.0)
Hemoglobin: 14.6 g/dL (ref 12.0–15.0)
MCH: 30 pg (ref 26.0–34.0)
MCHC: 33 g/dL (ref 30.0–36.0)
MCV: 91 fL (ref 80.0–100.0)
Platelets: 343 10*3/uL (ref 150–400)
RBC: 4.87 MIL/uL (ref 3.87–5.11)
RDW: 13.6 % (ref 11.5–15.5)
WBC: 9 10*3/uL (ref 4.0–10.5)
nRBC: 0 % (ref 0.0–0.2)

## 2022-11-11 LAB — GLUCOSE, CAPILLARY: Glucose-Capillary: 262 mg/dL — ABNORMAL HIGH (ref 70–99)

## 2022-11-17 DIAGNOSIS — R269 Unspecified abnormalities of gait and mobility: Secondary | ICD-10-CM | POA: Insufficient documentation

## 2022-11-20 ENCOUNTER — Encounter (HOSPITAL_COMMUNITY)
Admission: RE | Disposition: A | Discharge status: Home / Self Care | Payer: Self-pay | Source: Home / Self Care | Attending: Urology

## 2022-11-20 ENCOUNTER — Ambulatory Visit (HOSPITAL_COMMUNITY)
Admission: RE | Admit: 2022-11-20 | Discharge: 2022-11-20 | Disposition: A | Discharge status: Home / Self Care | Payer: Medicare HMO | Attending: Urology | Admitting: Urology

## 2022-11-20 ENCOUNTER — Encounter (HOSPITAL_COMMUNITY): Payer: Self-pay | Admitting: Urology

## 2022-11-20 ENCOUNTER — Ambulatory Visit (HOSPITAL_COMMUNITY): Payer: Medicare HMO | Admitting: Anesthesiology

## 2022-11-20 ENCOUNTER — Ambulatory Visit (HOSPITAL_BASED_OUTPATIENT_CLINIC_OR_DEPARTMENT_OTHER): Payer: Medicare HMO | Admitting: Anesthesiology

## 2022-11-20 ENCOUNTER — Other Ambulatory Visit: Payer: Self-pay

## 2022-11-20 DIAGNOSIS — K219 Gastro-esophageal reflux disease without esophagitis: Secondary | ICD-10-CM | POA: Diagnosis not present

## 2022-11-20 DIAGNOSIS — I10 Essential (primary) hypertension: Secondary | ICD-10-CM | POA: Diagnosis not present

## 2022-11-20 DIAGNOSIS — Z7984 Long term (current) use of oral hypoglycemic drugs: Secondary | ICD-10-CM | POA: Diagnosis not present

## 2022-11-20 DIAGNOSIS — Z87891 Personal history of nicotine dependence: Secondary | ICD-10-CM | POA: Insufficient documentation

## 2022-11-20 DIAGNOSIS — Z79899 Other long term (current) drug therapy: Secondary | ICD-10-CM | POA: Insufficient documentation

## 2022-11-20 DIAGNOSIS — Z1231 Encounter for screening mammogram for malignant neoplasm of breast: Secondary | ICD-10-CM

## 2022-11-20 DIAGNOSIS — Z01818 Encounter for other preprocedural examination: Secondary | ICD-10-CM

## 2022-11-20 DIAGNOSIS — E7800 Pure hypercholesterolemia, unspecified: Secondary | ICD-10-CM | POA: Insufficient documentation

## 2022-11-20 DIAGNOSIS — E119 Type 2 diabetes mellitus without complications: Secondary | ICD-10-CM | POA: Diagnosis not present

## 2022-11-20 DIAGNOSIS — C679 Malignant neoplasm of bladder, unspecified: Secondary | ICD-10-CM

## 2022-11-20 DIAGNOSIS — C670 Malignant neoplasm of trigone of bladder: Secondary | ICD-10-CM | POA: Diagnosis present

## 2022-11-20 DIAGNOSIS — N3289 Other specified disorders of bladder: Secondary | ICD-10-CM

## 2022-11-20 HISTORY — PX: TRANSURETHRAL RESECTION OF BLADDER TUMOR WITH MITOMYCIN-C: SHX6459

## 2022-11-20 LAB — GLUCOSE, CAPILLARY
Glucose-Capillary: 191 mg/dL — ABNORMAL HIGH (ref 70–99)
Glucose-Capillary: 149 mg/dL — ABNORMAL HIGH (ref 70–99)

## 2022-11-20 SURGERY — TRANSURETHRAL RESECTION OF BLADDER TUMOR WITH MITOMYCIN-C
Anesthesia: General | Site: Bladder

## 2022-11-20 MED ORDER — CEFAZOLIN SODIUM-DEXTROSE 2-4 GM/100ML-% IV SOLN
2.0000 g | INTRAVENOUS | Status: AC
  Administered 2022-11-20: 2 g via INTRAVENOUS
  Filled 2022-11-20: qty 100

## 2022-11-20 MED ORDER — MIDAZOLAM HCL 2 MG/2ML IJ SOLN
INTRAMUSCULAR | Status: AC
  Filled 2022-11-20: qty 2

## 2022-11-20 MED ORDER — CHLORHEXIDINE GLUCONATE 0.12 % MT SOLN: 15.0000 mL | Freq: Once | OROMUCOSAL | Status: DC

## 2022-11-20 MED ORDER — HYDROMORPHONE HCL 1 MG/ML IJ SOLN
INTRAMUSCULAR | Status: AC
  Filled 2022-11-20: qty 1

## 2022-11-20 MED ORDER — ONDANSETRON HCL 4 MG/2ML IJ SOLN
INTRAMUSCULAR | Status: AC
  Administered 2022-11-20: 4 mg via INTRAVENOUS
  Filled 2022-11-20: qty 2

## 2022-11-20 MED ORDER — ONDANSETRON HCL 4 MG/2ML IJ SOLN: 4.0000 mg | Freq: Once | INTRAMUSCULAR | Status: AC | PRN

## 2022-11-20 MED ORDER — ORAL CARE MOUTH RINSE: 15.0000 mL | Freq: Once | OROMUCOSAL | Status: DC

## 2022-11-20 MED ORDER — GEMCITABINE CHEMO FOR BLADDER INSTILLATION 2000 MG
2000.0000 mg | Freq: Once | INTRAVENOUS | Status: AC
  Administered 2022-11-20: 2000 mg via INTRAVESICAL
  Filled 2022-11-20: qty 2000

## 2022-11-20 MED ORDER — MIDAZOLAM HCL 5 MG/5ML IJ SOLN
INTRAMUSCULAR | Status: DC | PRN
  Administered 2022-11-20: 2 mg via INTRAVENOUS

## 2022-11-20 MED ORDER — OXYCODONE-ACETAMINOPHEN 5-325 MG PO TABS
1.0000 | ORAL_TABLET | ORAL | 0 refills | Status: AC | PRN
Start: 2022-11-20 — End: ?

## 2022-11-20 MED ORDER — ONDANSETRON HCL 4 MG/2ML IJ SOLN
INTRAMUSCULAR | Status: DC | PRN
  Administered 2022-11-20: 4 mg via INTRAVENOUS

## 2022-11-20 MED ORDER — FENTANYL CITRATE (PF) 100 MCG/2ML IJ SOLN
INTRAMUSCULAR | Status: DC | PRN
  Administered 2022-11-20 (×2): 50 ug via INTRAVENOUS

## 2022-11-20 MED ORDER — LACTATED RINGERS IV SOLN: INTRAVENOUS | Status: DC

## 2022-11-20 MED ORDER — AMISULPRIDE (ANTIEMETIC) 5 MG/2ML IV SOLN: 10.0000 mg | Freq: Once | INTRAVENOUS | Status: DC | PRN

## 2022-11-20 MED ORDER — FENTANYL CITRATE (PF) 100 MCG/2ML IJ SOLN
INTRAMUSCULAR | Status: AC
  Filled 2022-11-20: qty 2

## 2022-11-20 MED ORDER — HYDROMORPHONE HCL 1 MG/ML IJ SOLN
0.2500 mg | INTRAMUSCULAR | Status: DC | PRN
  Administered 2022-11-20 (×2): 0.5 mg via INTRAVENOUS

## 2022-11-20 MED ORDER — ROCURONIUM BROMIDE 100 MG/10ML IV SOLN
INTRAVENOUS | Status: DC | PRN
  Administered 2022-11-20: 50 mg via INTRAVENOUS

## 2022-11-20 MED ORDER — OXYCODONE HCL 5 MG PO TABS: 5.0000 mg | ORAL_TABLET | Freq: Once | ORAL | Status: DC | PRN

## 2022-11-20 MED ORDER — ONDANSETRON HCL 4 MG/2ML IJ SOLN
INTRAMUSCULAR | Status: AC
  Filled 2022-11-20: qty 2

## 2022-11-20 MED ORDER — SUGAMMADEX SODIUM 200 MG/2ML IV SOLN
INTRAVENOUS | Status: DC | PRN
Start: 2022-11-20 — End: 2022-11-20
  Administered 2022-11-20: 200 mg via INTRAVENOUS

## 2022-11-20 MED ORDER — PROPOFOL 10 MG/ML IV BOLUS
INTRAVENOUS | Status: AC
  Filled 2022-11-20: qty 20

## 2022-11-20 MED ORDER — PROPOFOL 10 MG/ML IV BOLUS
INTRAVENOUS | Status: DC | PRN
  Administered 2022-11-20: 40 mg via INTRAVENOUS
  Administered 2022-11-20: 120 mg via INTRAVENOUS

## 2022-11-20 MED ORDER — SODIUM CHLORIDE 0.9 % IR SOLN
Status: DC | PRN
  Administered 2022-11-20: 6000 mL via INTRAVESICAL

## 2022-11-20 MED ORDER — ONDANSETRON HCL 4 MG PO TABS: 4.0000 mg | ORAL_TABLET | Freq: Every day | ORAL | 0 refills | Status: AC | PRN

## 2022-11-20 MED ORDER — LIDOCAINE HCL (CARDIAC) PF 100 MG/5ML IV SOSY
PREFILLED_SYRINGE | INTRAVENOUS | Status: DC | PRN
  Administered 2022-11-20: 60 mg via INTRAVENOUS

## 2022-11-20 MED ORDER — OXYCODONE HCL 5 MG/5ML PO SOLN: 5.0000 mg | Freq: Once | ORAL | Status: DC | PRN

## 2022-11-20 SURGICAL SUPPLY — 19 items
BAG DRN RND TRDRP ANRFLXCHMBR (UROLOGICAL SUPPLIES)
BAG URINE DRAIN 2000ML AR STRL (UROLOGICAL SUPPLIES) IMPLANT
BAG URO CATCHER STRL LF (MISCELLANEOUS) ×2 IMPLANT
CATH FOLEY 2WAY SLVR 5CC 18FR (CATHETERS) IMPLANT
DRAPE FOOT SWITCH (DRAPES) ×2 IMPLANT
ELECT REM PT RETURN 15FT ADLT (MISCELLANEOUS) ×2 IMPLANT
EVACUATOR MICROVAS BLADDER (UROLOGICAL SUPPLIES) IMPLANT
GLOVE SURG LX STRL 7.5 STRW (GLOVE) ×2 IMPLANT
GOWN STRL REUS W/ TWL XL LVL3 (GOWN DISPOSABLE) ×2 IMPLANT
GOWN STRL REUS W/TWL XL LVL3 (GOWN DISPOSABLE) ×1
HOLDER FOLEY CATH W/STRAP (MISCELLANEOUS) IMPLANT
KIT TURNOVER KIT A (KITS) IMPLANT
LOOP CUT BIPOLAR 24F LRG (ELECTROSURGICAL) IMPLANT
MANIFOLD NEPTUNE II (INSTRUMENTS) ×2 IMPLANT
PACK CYSTO (CUSTOM PROCEDURE TRAY) ×2 IMPLANT
SYR TOOMEY IRRIG 70ML (MISCELLANEOUS)
SYRINGE TOOMEY IRRIG 70ML (MISCELLANEOUS) IMPLANT
TUBING CONNECTING 10 (TUBING) ×2 IMPLANT
TUBING UROLOGY SET (TUBING) ×2 IMPLANT

## 2022-11-20 NOTE — Discharge Instructions (Addendum)
Activity:  You are encouraged to ambulate frequently (about every hour during waking hours) to help prevent blood clots from forming in your legs or lungs.   ? ?Diet: You should advance your diet as instructed by your physician.  It will be normal to have some bloating, nausea, and abdominal discomfort intermittently. ? ?Prescriptions:  You will be provided a prescription for pain medication to take as needed.  If your pain is not severe enough to require the prescription pain medication, you may take extra strength Tylenol instead which will have less side effects.  You should also take a prescribed stool softener to avoid straining with bowel movements as the prescription pain medication may constipate you. ? ?What to call us about: You should call the office ([phone]) if you develop fever > 101 or develop persistent vomiting. Activity:  You are encouraged to ambulate frequently (about every hour during waking hours) to help prevent blood clots from forming in your legs or lungs.  ?

## 2022-11-20 NOTE — Anesthesia Postprocedure Evaluation (Signed)
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  Last Vitals:  Vitals:   11/20/22 1530 11/20/22 1610  BP: 135/67   Pulse: 81   Resp: 17   Temp:  36.5 C  SpO2: 100%     Last Pain:  Vitals:   11/20/22 1610  TempSrc: Oral  PainSc:                  Gilbert Narain A.

## 2022-11-20 NOTE — H&P (Signed)
Office Visit Report     10/26/2022   --------------------------------------------------------------------------------   Sue Ruiz  MRN: [id number]  DOB: January 12, 1946, 77 year old Female  SSN:    PRIMARY CARE:  Lula Olszewski, MD  PRIMARY CARE FAX:  [phone]  REFERRING:  [name], MD  PROVIDER:  Jettie Pagan, M.D.  LOCATION:  [facility] 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 High risk nonmuscle base of bladder cancer:  -She was found to have intermittent gross hematuria. She does have significant smoking history 2 packs/day for over 25 years however quit 25 years ago. She denied family history of bladder cancer. Cystoscopy 06/2022 by Dr. Benancio Deeds with at least 3 cm papillary tumor on the right lateral wall and posterior bladder. CT hematuria protocol 06/2022 with subtle irregularity of the mucosal surface along the posterior right lateral bladder wall. She had delay in treatment.  -S/p TURBT with gemcitabine on 10/16/2022. 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   She has recovered well from surgery. She denies abdominal pain gross hematuria. Patient currently denies fever, chills, sweats, nausea, vomiting, abdominal or flank pain, gross hematuria or dysuria.     ALLERGIES: No Allergies    MEDICATIONS: Metformin Hcl 500 mg tablet  Esomeprazole Magnesium 20 mg capsule,delayed release 1 capsule PO Daily  Glipizide 5 mg tablet  Hydrochlorothiazide 25 mg tablet 1 tablet PO Daily  Pioglitazone Hcl 45 mg tablet 1 tablet PO Daily  Pravastatin Sodium 40 mg tablet 1 tablet PO Daily     GU PSH: Cystoscopy - 07/10/2022 Locm 300-399Mg /Ml Iodine,1Ml - 07/08/2022     NON-GU PSH: Hysterectomy     GU PMH: Bladder Cancer Lateral - 10/05/2022, - 07/10/2022 Bladder  Cancer Posterior - 10/05/2022, - 07/10/2022 Gross hematuria - 10/05/2022, - 07/08/2022, - 06/11/2022    NON-GU PMH: Diabetes Type 2 GERD Hypercholesterolemia Hypertension    FAMILY HISTORY: No Family History    SOCIAL HISTORY: Marital Status: Unknown Current Smoking Status: Patient does not smoke anymore.   Tobacco Use Assessment Completed: Used Tobacco in last 30 days? Has never drank.  Drinks 2 caffeinated drinks per day.    REVIEW OF SYSTEMS:    GU Review Female:   Patient denies frequent urination, hard to postpone urination, burning /pain with urination, get up at night to urinate, leakage of urine, stream starts and stops, trouble starting your stream, have to strain to urinate, and being pregnant.  Gastrointestinal (Upper):   Patient denies nausea, vomiting, and indigestion/ heartburn.  Gastrointestinal (Lower):   Patient denies diarrhea and constipation.  Constitutional:   Patient denies fever, night sweats, weight loss, and fatigue.  Skin:   Patient denies skin rash/ lesion and itching.  Eyes:   Patient denies blurred vision and double vision.  Ears/ Nose/ Throat:   Patient denies sore throat and sinus problems.  Hematologic/Lymphatic:   Patient denies swollen glands and easy bruising.  Cardiovascular:   Patient denies leg swelling and chest pains.  Respiratory:   Patient denies cough and shortness of breath.  Endocrine:   Patient denies excessive thirst.  Musculoskeletal:   Patient denies back pain and joint pain.  Neurological:   Patient denies headaches and dizziness.  Psychologic:   Patient denies depression and anxiety.   VITAL SIGNS: None   MULTI-SYSTEM PHYSICAL EXAMINATION:    Constitutional: Well-nourished. No  physical deformities. Normally developed. Good grooming.  Respiratory: No labored breathing, no use of accessory muscles.   Cardiovascular: Normal temperature, normal extremity pulses, no swelling, no varicosities.  Gastrointestinal: No mass, no tenderness,  no rigidity, non obese abdomen.     Complexity of Data:  Source Of History:  Patient, Medical Record Summary  Records Review:   Pathology Reports, Previous Doctor Records, Previous Hospital Records, Previous Patient Records  Urine Test Review:   Urinalysis   PROCEDURES:          Visit Complexity - G2211          Urinalysis w/Scope - 81001 Dipstick Dipstick Cont'd Micro  Color: Yellow Bilirubin: Neg WBC/hpf: 0 - 5/hpf  Appearance: Clear Ketones: Neg RBC/hpf: 3 - 10/hpf  Specific Gravity: <=1.005 Blood: 3+ Bacteria: Few (10-25/hpf)  pH: <=5.0 Protein: Neg Cystals: NS (Not Seen)  Glucose: Neg Urobilinogen: 0.2 Casts: NS (Not Seen)    Nitrites: Neg Trichomonas: Not Present    Leukocyte Esterase: Trace Mucous: Not Present      Epithelial Cells: 0 - 5/hpf      Yeast: NS (Not Seen)      Sperm: Not Present    Notes:      ASSESSMENT:      ICD-10 Details  1 GU:   Bladder Cancer Lateral - C67.2    PLAN:           Document Letter(s):  Created for Patient: Clinical Summary         Notes:    #1. High risk nonmuscle base of bladder cancer:  -I reviewed pathology today with HG Ta UCB (no muscle present). We discussed NCCN guidelines recommending option of reresection versus BCG induction. She elects proceed with reresection TURBT. Surgery letter sent.   Discussed that we will proceed with BCG induction which will begin 3-4 weeks after TURBT. We discussed BCG induction consists of weekly installations for 6 consecutive weeks. We will then evaluate with cystoscopy at 3 months post-TURBT and assuming if negative, we will proceed with maintenance BCG therapy which will consist of 3 weekly instillations at months 3, 6, 12, 18, 24, 30 and 36. We discussed that BCG would be withheld of traumatic catheterization, bacteriuria, persistent gross hematuria, persistent severe local systems or systemic symptoms.   CC: Lula Olszewski, MD        Next Appointment:      Next Appointment: 11/20/2022  01:45 PM    Appointment Type: Surgery     Location: [facility] [phone]    Provider: Jettie Pagan, M.D.    Reason for Visit: [facility]/OP TURBT WITH GEMCITABINE     Urology Preoperative H&P   Chief Complaint: Bladder cancer  History of Present Illness: Sue Ruiz is a 77 y.o. female with HG Ta UCB who is here for re-resection TURBT. Denies fevers, chills, dysuria.    Past Medical History:  Diagnosis Date   Anemia 2020   hx of iron infusions   Barrett's esophagus    Chest pain 12/19/2021   Per patient, she was seen in the ED and told her pain was due to acid reflux.   Diabetes mellitus without complication [facility])    Patient follows w/ PCP, Dr. Elesa Massed .   Enlarged liver    Family history of adverse reaction to anesthesia    Patient states that her oldest daughter is difficult to wake up from anesthesia.   Fibromyalgia    Follows w/ PCP, Dr. Elesa Massed.  GERD (gastroesophageal reflux disease)    Takes Nexium daily.   High cholesterol    Hypertension    Patient follows w/ PCP, Dr. Elesa Massed.   IBS (irritable bowel syndrome)    Loose, teeth 2024   Patient states that she has several loose teeth on the front bottom.   Vertigo    Wears glasses     Past Surgical History:  Procedure Laterality Date   COLONOSCOPY  11/14/2018   CYSTOSCOPY N/A 10/16/2022   Procedure: CYSTOSCOPY;  Surgeon: [name], MD;  Location: [facility];  Service: Urology;  Laterality: N/A;   ESOPHAGOGASTRODUODENOSCOPY  10/15/2021   TOTAL VAGINAL HYSTERECTOMY  2013   with BSO - confirmed by op report and pathology. 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-C N/A 10/16/2022   Procedure: TRANSURETHRAL RESECTION OF BLADDER TUMOR WITH INSTILLATION OF GEMCITABINE;  Surgeon: [name], MD;  Location: [facility];  Service: Urology;  Laterality: N/A;  45 MINUTES NEEDED FOR CASE    Allergies:  Allergies  Allergen Reactions   Codeine Swelling     Tongue gets swollen and a rash   Asa [Aspirin] Rash    Some swelling in lips.   Chlorhexidine Hives and Rash    Preop CHG gave her a rash    Insulin Glargine Hives   Losartan Swelling    Swelling in fingers    Family History  Problem Relation Age of Onset   Diabetes Mother    Hypertension Mother    Diabetes Father    Hypertension Father    Diabetes Brother    Hypertension Brother     Social History:  reports that she has quit smoking. Her smoking use included cigarettes. She has never used smokeless tobacco. She reports that she does not currently use alcohol. She reports that she does not use drugs.  ROS: A complete review of systems was performed.  All systems are negative except for pertinent findings as noted.  Physical Exam:  Vital signs in last 24 hours: Temp:  [97.9 F (36.6 C)] 97.9 F (36.6 C) (09/06 1200) Pulse Rate:  [82] 82 (09/06 1200) Resp:  [17] 17 (09/06 1200) BP: (126)/(67) 126/67 (09/06 1200) SpO2:  [96 %] 96 % (09/06 1200) Weight:  [68.5 kg] 68.5 kg (09/06 1151) Constitutional:  Alert and oriented, No acute distress Cardiovascular: Regular rate and rhythm Respiratory: Normal respiratory effort, Lungs clear bilaterally GI: Abdomen is soft, nontender, nondistended, no abdominal masses GU: No CVA tenderness Lymphatic: No lymphadenopathy Neurologic: Grossly intact, no focal deficits Psychiatric: Normal mood and affect  Laboratory Data:  No results for input(s): "WBC", "HGB", "HCT", "PLT" in the last 72 hours.  No results for input(s): "NA", "K", "CL", "GLUCOSE", "BUN", "CALCIUM", "CREATININE" in the last 72 hours.  Invalid input(s): "CO3"   Results for orders placed or performed during the hospital encounter of 11/20/22 (from the past 24 hour(s))  Glucose, capillary     Status: Abnormal   Collection Time: 11/20/22 12:08 PM  Result Value Ref Range   Glucose-Capillary 191 (H) 70 - 99 mg/dL   No results found for this or any previous visit (from  the past 240 hour(s)).  Renal Function: No results for input(s): "CREATININE" in the last 168 hours. Estimated Creatinine Clearance: 53.3 mL/min (by C-G formula based on SCr of 0.56 mg/dL).  Radiologic Imaging: No results found.  I independently reviewed the above imaging studies.  Assessment and Plan KENORA WIDMER is a 77 y.o.  female with HG Ta UCB here for re-resection TURBT.   Risks and benefits of Transurethral Resection of Bladder Tumor were reviewed in detail including infection, bleeding, blood transfusion, injury to bladder/urethra/surrounding structures, bladder perforation, obstructive and irritative voiding symptoms, and global anesthesia risks including but not limited to CVA, MI, DVT, PE, pneumonia, and death. Patient expressed understanding and desire to proceed.      Matt R. Jerret Mcbane MD 11/20/2022, 12:53 PM  [facility] Pager: [phone]

## 2022-11-20 NOTE — Anesthesia Procedure Notes (Addendum)
Procedure Name: Intubation Date/Time: 11/20/2022 1:18 PM  Performed by: Kaylyn Layer, MDPre-anesthesia Checklist: Patient identified, Patient being monitored, Timeout performed, Emergency Drugs available and Suction available Patient Re-evaluated:Patient Re-evaluated prior to induction Oxygen Delivery Method: Circle system utilized Preoxygenation: Pre-oxygenation with 100% oxygen Induction Type: IV induction Ventilation: Mask ventilation without difficulty Laryngoscope Size: Miller and 2 Grade View: Grade II Tube type: Oral Tube size: 7.0 mm Number of attempts: 2 Airway Equipment and Method: Stylet Placement Confirmation: ETT inserted through vocal cords under direct vision, positive ETCO2 and breath sounds checked- equal and bilateral Secured at: 21 cm Tube secured with: Tape Dental Injury: Teeth and Oropharynx as per pre-operative assessment  Difficulty Due To: Difficult Airway- due to anterior larynx Future Recommendations: Recommend- induction with short-acting agent, and alternative techniques readily available Comments: First attempt by CRNA with Mac 3 blade, grade 3 view. Second attempt by Dereck Ligas, MD with grade 2B view with Miller 2 blade. Atraumatic intubation.

## 2022-11-20 NOTE — Op Note (Signed)
Operative Note  Preoperative diagnosis:  1.  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  Same  Procedure(s): 1.  TURBT large 2. Instillation of gemcitabine  Surgeon: Jettie Pagan, MD  Assistants:  None  Anesthesia:  General  Complications:  None  EBL:  Minimal  Specimens: 1.  ID Type Source Tests Collected by Time Destination  1 : bladder tumor Tissue PATH GU tumor resection SURGICAL PATHOLOGY [name], MD 11/20/2022 1335    Drains/Catheters: 1.  18 Fr foley  Intraoperative findings:   Right lateral wall resection site with scar tissue and fibrinous debris but no visual evidence of recurrent tumor.  Number of tumors:          1 Size of largest tumor:          5 cm    Characteristics of tumors:    Nodular         Primary   Yes  Suspicious for Carcinoma in situ:    No  Clinical tumor stage:     cTa     Bimanual exam under anesthesia:        No evidence of 3D mass  Visually complete resection:                 Yes  Visualization of detrusor muscle in resection base:        Yes  Visual evaluation for perforation:             Performed, no evidence of perforation   Indication:  Sue Ruiz is a 77 y.o. female with a history of high grade urothelial carcinoma here for re-resection. All the risks, benefits were discussed with the patient to include but not limited to infection, pain, bleeding, damage to adjacent structures, need for further operations, adverse reaction to anesthesia and death.  Patient understands these risks and agrees to proceed with the operation as planned.    Description of procedure: After informed consent was obtained from the patient, the patient was taken to the operating room. General anesthesia was administered. The patient was placed in dorsal lithotomy position and prepped and draped in usual sterile fashion. Sequential compression devices were applied to lower extremities at the beginning of  the case for DVT prophylaxis. Antibiotics were infused prior to surgery start time. A surgical time-out was performed to properly identify the patient, the surgery to be performed, and the surgical site.     We then passed the 21-French rigid cystoscope down the urethra and into the bladder under direct vision without any difficulty. The anterior urethral was normal. The bladder was inspected with 30 and 70 degree lenses. Once in the bladder, systematic evaluation of bladder revealed a previous resected right lateral wall bladder mass with fibrinous debris present. No evidence of recurrence. The ureteral orfices were in orthotopic position and not involved.   We then removed the cystoscope and then passed down the 26 French resectoscope sheath down the urethra into the bladder under direct vision with the visual obturator. The tumor was resected down to muscle. The TUR bladder tumor chips were retrieved from the bladder and each region of resection was passed off the field as a separate specimen.  Hemostasis was achieved using electrocautery. We then proceeded with removing the resectoscope and then placed in an 18 Foley catheter.  The patient tolerated the procedure well with no complication and was awoken from anesthesia and taken to recovery in stable condition.  While in the post-operative care unit, as a separate procedure, 2000 mg of gemcitabine in 50 mL of water was instilled in the bladder through the catheter and the catheter was plugged. This will remain indwelling for approximately one hour. It will then be drained from the bladder and the catheter will be removed and the patient discharged home.  Matt R. Chanz Cahall MD [facility]  Pager: [phone]

## 2022-11-20 NOTE — Anesthesia Preprocedure Evaluation (Addendum)
Anesthesia Evaluation  Patient identified by MRN, date of birth, ID band Patient awake    Reviewed: Allergy & Precautions, NPO status , Patient's Chart, lab work & pertinent test results, reviewed documented beta blocker date and time   History of Anesthesia Complications (+) Family history of anesthesia reactionNegative for: history of anesthetic complications  Airway Mallampati: II  TM Distance: >3 FB Neck ROM: Full    Dental  (+) Missing, Poor Dentition, Loose, Chipped, Dental Advisory Given, Caps,    Pulmonary former smoker   breath sounds clear to auscultation       Cardiovascular hypertension, Pt. on medications (-) angina  Rhythm:Regular Rate:Normal     Neuro/Psych  Neuromuscular disease  negative psych ROS   GI/Hepatic Neg liver ROS,GERD  Medicated and Controlled,,  Endo/Other  diabetes, Well Controlled, Type 2, Oral Hypoglycemic Agents  Hyperlipidemia  Renal/GU negative Renal ROS   Bladder tumor    Musculoskeletal  (+)  Fibromyalgia -  Abdominal   Peds  Hematology  (+) Blood dyscrasia, anemia   Anesthesia Other Findings   Reproductive/Obstetrics                             Anesthesia Physical Anesthesia Plan  ASA: 2  Anesthesia Plan: General   Post-op Pain Management: Tylenol PO (pre-op)* and Dilaudid IV   Induction: Intravenous  PONV Risk Score and Plan: 3 and Ondansetron, Dexamethasone and Treatment may vary due to age or medical condition  Airway Management Planned: Oral ETT  Additional Equipment: None  Intra-op Plan:   Post-operative Plan: Extubation in OR  Informed Consent: I have reviewed the patients History and Physical, chart, labs and discussed the procedure including the risks, benefits and alternatives for the proposed anesthesia with the patient or authorized representative who has indicated his/her understanding and acceptance.     Dental advisory  given  Plan Discussed with: CRNA, Surgeon and Anesthesiologist  Anesthesia Plan Comments:         Anesthesia Quick Evaluation

## 2022-11-20 NOTE — Transfer of Care (Signed)
Immediate Anesthesia Transfer of Care Note  Patient: Sue Ruiz  Procedure(s) Performed: TRANSURETHRAL RESECTION OF BLADDER TUMOR WITH GEMCITABINE INSTILLATION (Bladder)  Patient Location: PACU  Anesthesia Type:General  Level of Consciousness: awake, alert , and oriented  Airway & Oxygen Therapy: Patient Spontanous Breathing and Patient connected to face mask oxygen  Post-op Assessment: Report given to RN and Post -op Vital signs reviewed and stable  Post vital signs: Reviewed and stable  Last Vitals:  Vitals Value Taken Time  BP 155/100 11/20/22 1356  Temp    Pulse 97 11/20/22 1358  Resp 19 11/20/22 1358  SpO2 100 % 11/20/22 1358  Vitals shown include unfiled device data.  Last Pain:  Vitals:   11/20/22 1200  TempSrc: Oral  PainSc:          Complications: No notable events documented.

## 2022-11-21 ENCOUNTER — Encounter (HOSPITAL_COMMUNITY): Payer: Self-pay | Admitting: Urology

## 2022-11-23 ENCOUNTER — Other Ambulatory Visit: Payer: Self-pay | Admitting: Adult Health

## 2022-11-23 DIAGNOSIS — Z1231 Encounter for screening mammogram for malignant neoplasm of breast: Secondary | ICD-10-CM

## 2022-11-24 LAB — SURGICAL PATHOLOGY

## 2022-12-02 DIAGNOSIS — W19XXXA Unspecified fall, initial encounter: Secondary | ICD-10-CM | POA: Insufficient documentation

## 2022-12-02 DIAGNOSIS — Z9189 Other specified personal risk factors, not elsewhere classified: Secondary | ICD-10-CM | POA: Insufficient documentation

## 2022-12-15 DIAGNOSIS — M545 Low back pain, unspecified: Secondary | ICD-10-CM | POA: Insufficient documentation

## 2023-01-01 DIAGNOSIS — M7071 Other bursitis of hip, right hip: Secondary | ICD-10-CM | POA: Insufficient documentation

## 2023-02-03 DIAGNOSIS — Z5982 Transportation insecurity: Secondary | ICD-10-CM | POA: Insufficient documentation

## 2023-02-03 DIAGNOSIS — Z599 Problem related to housing and economic circumstances, unspecified: Secondary | ICD-10-CM | POA: Insufficient documentation

## 2023-03-02 ENCOUNTER — Ambulatory Visit: Payer: Medicare HMO | Admitting: Podiatry

## 2023-03-02 ENCOUNTER — Ambulatory Visit (INDEPENDENT_AMBULATORY_CARE_PROVIDER_SITE_OTHER): Payer: Medicare HMO

## 2023-03-02 DIAGNOSIS — B351 Tinea unguium: Secondary | ICD-10-CM | POA: Diagnosis not present

## 2023-03-02 DIAGNOSIS — S99929A Unspecified injury of unspecified foot, initial encounter: Secondary | ICD-10-CM

## 2023-03-02 DIAGNOSIS — M79674 Pain in right toe(s): Secondary | ICD-10-CM | POA: Diagnosis not present

## 2023-03-02 DIAGNOSIS — S9031XA Contusion of right foot, initial encounter: Secondary | ICD-10-CM | POA: Diagnosis not present

## 2023-03-02 DIAGNOSIS — M79675 Pain in left toe(s): Secondary | ICD-10-CM | POA: Diagnosis not present

## 2023-03-02 DIAGNOSIS — E119 Type 2 diabetes mellitus without complications: Secondary | ICD-10-CM | POA: Diagnosis not present

## 2023-03-02 NOTE — Progress Notes (Unsigned)
Subjective:  Patient ID: Sue Ruiz, female    DOB: Dec 17, 1945,  MRN: [id number]  Sue Ruiz presents to clinic today for:  Chief Complaint  Patient presents with   Nail Problem    Right foot, Big toe/ thick toenail and is very painful/Diabetic   Patient notes nails are thick, discolored, elongated and painful in shoegear when trying to ambulate.  Patient notes that she dropped a can of tomato paste on her right great toe 4 months ago and states that the area is still tender.  She wants to make sure it is okay.  She is concerned of possible fungus in some of the lesser toenails patient is diabetic but denies symptoms of peripheral neuropathy  PCP is Combs, Prince Solian, DO.  Past Medical History:  Diagnosis Date   Anemia 2020   hx of iron infusions   Barrett's esophagus    Chest pain 12/19/2021   Per patient, she was seen in the ED and told her pain was due to acid reflux.   Diabetes mellitus without complication [facility])    Patient follows w/ PCP, Dr. Elesa Massed .   Enlarged liver    Family history of adverse reaction to anesthesia    Patient states that her oldest daughter is difficult to wake up from anesthesia.   Fibromyalgia    Follows w/ PCP, Dr. Elesa Massed.   GERD (gastroesophageal reflux disease)    Takes Nexium daily.   High cholesterol    Hypertension    Patient follows w/ PCP, Dr. Elesa Massed.   IBS (irritable bowel syndrome)    Loose, teeth 2024   Patient states that she has several loose teeth on the front bottom.   Vertigo    Wears glasses     Past Surgical History:  Procedure Laterality Date   COLONOSCOPY  11/14/2018   CYSTOSCOPY N/A 10/16/2022   Procedure: CYSTOSCOPY;  Surgeon: [name], MD;  Location: [facility];  Service: Urology;  Laterality: N/A;   ESOPHAGOGASTRODUODENOSCOPY  10/15/2021   TOTAL VAGINAL HYSTERECTOMY  2013   with BSO - confirmed by op report and pathology. Done for prolapse   TRANSURETHRAL RESECTION OF BLADDER  TUMOR WITH MITOMYCIN-C N/A 10/16/2022   Procedure: TRANSURETHRAL RESECTION OF BLADDER TUMOR WITH INSTILLATION OF GEMCITABINE;  Surgeon: [name], MD;  Location: [facility];  Service: Urology;  Laterality: N/A;  45 MINUTES NEEDED FOR CASE   TRANSURETHRAL RESECTION OF BLADDER TUMOR WITH MITOMYCIN-C N/A 11/20/2022   Procedure: TRANSURETHRAL RESECTION OF BLADDER TUMOR WITH GEMCITABINE INSTILLATION;  Surgeon: [name], MD;  Location: [facility];  Service: Urology;  Laterality: N/A;  30 MINUTE NEEDED FOR CASE  ANESTHESIA IS GENERAL WITH PARALYSIS    Allergies  Allergen Reactions   Codeine Swelling    Tongue gets swollen and a rash   Asa [Aspirin] Rash    Some swelling in lips.   Chlorhexidine Hives and Rash    Preop CHG gave her a rash    Insulin Glargine Hives   Losartan Swelling    Swelling in fingers    Review of Systems: Negative except as noted in the HPI.  Objective:  Sue Ruiz is a pleasant 77 y.o. female in NAD. AAO x 3.  Vascular Examination: Capillary refill time is 3-5 seconds to toes bilateral. Palpable pedal pulses b/l LE. Digital hair present b/l.  Skin temperature gradient WNL b/l. No varicosities b/l. No cyanosis noted b/l.   Dermatological Examination: Pedal skin with  normal turgor, texture and tone b/l. No open wounds. No interdigital macerations b/l. Toenails x10 are 3mm thick, discolored, dystrophic with subungual debris. There is pain with compression of the nail plates.  They are elongated x10  Neurological Examination: Protective sensation intact bilateral LE. Vibratory sensation intact bilateral LE.  Musculoskeletal Examination: Muscle strength 5/5 to all LE muscle groups b/l.  There is pain on palpation to the dorsal aspect of the right great toe.  No ecchymosis is noted.  No palpable loose bodies are noted.  No crepitus with range of motion of the first MPJ.    Latest Ref Rng & Units 10/14/2022   11:50 AM  Hemoglobin A1C  Hemoglobin-A1c 4.8 - 5.6 %  8.1    Radiographic examination (right foot, 3 weightbearing views, 03/02/2023): Normal osseous mineralization.  No foreign body seen within the soft tissues.  No fracture or dislocation seen.  Assessment/Plan: 1. Contusion of right foot, initial encounter   2. Toe injury, unspecified laterality, initial encounter   3. Pain due to onychomycosis of toenails of both feet   4. Type 2 diabetes mellitus without complication, without long-term current use of insulin (HCC)     The mycotic toenails were sharply debrided x10 with sterile nail nippers and a power debriding burr to decrease bulk/thickness and length.    Reviewed radiographic findings with the patient today.  Discussed shoe gear that will accommodate the discomfort.  If she continues to have discomfort in the area we can send for physical therapy but this should resolve on its own.  Return in about 3 months (around 05/31/2023) for [facility].   Clerance Lav, DPM, FACFAS [facility]     [address] [place], [place] [address]                Office [phone]  Fax [phone]

## 2023-03-03 ENCOUNTER — Ambulatory Visit: Payer: Medicare HMO | Admitting: Podiatry

## 2023-03-15 DIAGNOSIS — B351 Tinea unguium: Secondary | ICD-10-CM | POA: Insufficient documentation

## 2023-04-26 DIAGNOSIS — M8580 Other specified disorders of bone density and structure, unspecified site: Secondary | ICD-10-CM | POA: Insufficient documentation

## 2023-04-28 DIAGNOSIS — F330 Major depressive disorder, recurrent, mild: Secondary | ICD-10-CM | POA: Insufficient documentation

## 2023-06-07 ENCOUNTER — Ambulatory Visit: Admitting: Podiatry

## 2023-06-07 ENCOUNTER — Ambulatory Visit: Payer: Medicare HMO | Admitting: Podiatry

## 2023-06-21 ENCOUNTER — Ambulatory Visit: Admitting: Podiatry

## 2023-07-12 ENCOUNTER — Encounter: Admitting: Podiatry

## 2023-07-12 NOTE — Progress Notes (Signed)
Patient did not show for scheduled appointment today.

## 2023-08-23 ENCOUNTER — Ambulatory Visit (INDEPENDENT_AMBULATORY_CARE_PROVIDER_SITE_OTHER): Admitting: Podiatry

## 2023-08-23 DIAGNOSIS — B351 Tinea unguium: Secondary | ICD-10-CM | POA: Diagnosis not present

## 2023-08-23 DIAGNOSIS — M79674 Pain in right toe(s): Secondary | ICD-10-CM

## 2023-08-23 DIAGNOSIS — M79675 Pain in left toe(s): Secondary | ICD-10-CM

## 2023-08-23 NOTE — Progress Notes (Unsigned)
 Subjective:  Patient ID: Sue Ruiz, female    DOB: [birth year],  MRN: [id number]  Sue Ruiz presents to clinic today for:  Chief Complaint  Patient presents with   Nail Problem    Pt is here to have her nails trimmed down    Patient notes nails are thick, discolored, elongated and painful in shoegear when trying to ambulate.    PCP is Combs, Jannett Mems, DO.  Past Medical History:  Diagnosis Date   Anemia 2020   hx of iron infusions   Barrett's esophagus    Chest pain 12/19/2021   Per patient, she was seen in the ED and told her pain was due to acid reflux.   Diabetes mellitus without complication [facility])    Patient follows w/ PCP, Dr. Rachael Budd .   Enlarged liver    Family history of adverse reaction to anesthesia    Patient states that her oldest daughter is difficult to wake up from anesthesia.   Fibromyalgia    Follows w/ PCP, Dr. Rachael Budd.   GERD (gastroesophageal reflux disease)    Takes Nexium daily.   High cholesterol    Hypertension    Patient follows w/ PCP, Dr. Rachael Budd.   IBS (irritable bowel syndrome)    Loose, teeth 2024   Patient states that she has several loose teeth on the front bottom.   Vertigo    Wears glasses    Past Surgical History:  Procedure Laterality Date   COLONOSCOPY  11/14/2018   CYSTOSCOPY N/A 10/16/2022   Procedure: CYSTOSCOPY;  Surgeon: Lahoma Pigg, MD;  [facility];  Service: Urology;  Laterality: N/A;   ESOPHAGOGASTRODUODENOSCOPY  10/15/2021   TOTAL VAGINAL HYSTERECTOMY  2013   with BSO - confirmed by op report and pathology. 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   Chlorhexidine  Hives and Rash    Preop CHG gave her a rash    Insulin  Glargine Hives   Losartan Swelling    Swelling in fingers    Review of Systems: Negative except as noted in the HPI.  Objective:  Sue Ruiz is a pleasant 78 y.o. female in NAD. AAO x 3.  Vascular Examination: Capillary refill time is 3-5 seconds to toes bilateral. Palpable pedal pulses b/l LE. Digital hair present b/l.  Skin temperature gradient WNL b/l. No varicosities b/l. No cyanosis noted b/l.   Dermatological Examination: Pedal skin with normal turgor, texture and tone b/l. No open wounds. No interdigital macerations b/l. Toenails x10 are 3mm thick, discolored, dystrophic with subungual debris. There is pain with compression of the nail plates.  They are elongated x10     Latest Ref Rng & Units 10/14/2022   11:50 AM  Hemoglobin A1C  Hemoglobin-A1c 4.8 -  5.6 % 8.1    Assessment/Plan: 1. Pain due to onychomycosis of toenails of both feet    The mycotic toenails were sharply debrided x10 with sterile nail nippers and a power debriding burr to decrease bulk/thickness and length.    Return in about 3 months (around 11/23/2023) for [facility].   Joe Murders, DPM, FACFAS [facility]     [address] [place], [place] [address]                Office [phone]  Fax [phone]

## 2023-10-01 ENCOUNTER — Other Ambulatory Visit: Payer: Self-pay

## 2023-10-01 ENCOUNTER — Encounter (HOSPITAL_COMMUNITY): Payer: Self-pay

## 2023-10-01 ENCOUNTER — Emergency Department (HOSPITAL_COMMUNITY)

## 2023-10-01 ENCOUNTER — Emergency Department (HOSPITAL_COMMUNITY)
Admission: EM | Admit: 2023-10-01 | Discharge: 2023-10-02 | Disposition: A | Discharge status: Home / Self Care | Attending: Emergency Medicine | Admitting: Emergency Medicine

## 2023-10-01 DIAGNOSIS — S60041A Contusion of right ring finger without damage to nail, initial encounter: Secondary | ICD-10-CM | POA: Insufficient documentation

## 2023-10-01 DIAGNOSIS — Y9301 Activity, walking, marching and hiking: Secondary | ICD-10-CM | POA: Diagnosis not present

## 2023-10-01 DIAGNOSIS — S52125A Nondisplaced fracture of head of left radius, initial encounter for closed fracture: Secondary | ICD-10-CM | POA: Diagnosis not present

## 2023-10-01 DIAGNOSIS — E119 Type 2 diabetes mellitus without complications: Secondary | ICD-10-CM | POA: Diagnosis not present

## 2023-10-01 DIAGNOSIS — I10 Essential (primary) hypertension: Secondary | ICD-10-CM | POA: Diagnosis not present

## 2023-10-01 DIAGNOSIS — W1830XA Fall on same level, unspecified, initial encounter: Secondary | ICD-10-CM | POA: Diagnosis not present

## 2023-10-01 DIAGNOSIS — Z7984 Long term (current) use of oral hypoglycemic drugs: Secondary | ICD-10-CM | POA: Diagnosis not present

## 2023-10-01 DIAGNOSIS — Z79899 Other long term (current) drug therapy: Secondary | ICD-10-CM | POA: Diagnosis not present

## 2023-10-01 DIAGNOSIS — Y92512 Supermarket, store or market as the place of occurrence of the external cause: Secondary | ICD-10-CM | POA: Diagnosis not present

## 2023-10-01 DIAGNOSIS — M25522 Pain in left elbow: Secondary | ICD-10-CM | POA: Diagnosis present

## 2023-10-01 DIAGNOSIS — S60031A Contusion of right middle finger without damage to nail, initial encounter: Secondary | ICD-10-CM | POA: Diagnosis not present

## 2023-10-01 LAB — CBG MONITORING, ED: Glucose-Capillary: 174 mg/dL — ABNORMAL HIGH (ref 70–99)

## 2023-10-01 NOTE — ED Notes (Signed)
 PT daughter requesting call back for rapport [phone]. Stated [place] is [place] and mom is calling her back & fourth stated she is hungry requesting food and in pain!

## 2023-10-01 NOTE — ED Provider Triage Note (Signed)
 Emergency Medicine Provider Triage Evaluation Note  Sue Ruiz , a 78 y.o. female  was evaluated in triage.  Pt complains of fall.  Review of Systems  Positive:  Negative:   Physical Exam  BP (!) 140/57 (BP Location: Right Arm)   Pulse 81   Temp 98 F (36.7 C) (Oral)   Resp 17   SpO2 97%  Gen:   Awake, no distress   Resp:  Normal effort  MSK:   Moves extremities without difficulty  Other:    Medical Decision Making  Medically screening exam initiated at 6:05 PM.  Appropriate orders placed.  Sue Ruiz was informed that the remainder of the evaluation will be completed by another provider, this initial triage assessment does not replace that evaluation, and the importance of remaining in the ED until their evaluation is complete.  Patient endorses hurting her butt 3 months ago and has had gait problems since then. Patient was at Sue Ruiz today when she stumbled and fell. Patient denies head trauma, LOC, seizures, blood thinner, headache, vision changes. Endorsing right hand pain and left elbow pain.   Sue Ruiz, [place] 10/01/23 [phone]

## 2023-10-01 NOTE — ED Notes (Signed)
 Ortho tech notified on patient's splint order.

## 2023-10-01 NOTE — ED Notes (Signed)
 Daughter bascom [phone]

## 2023-10-01 NOTE — ED Triage Notes (Signed)
 [facility] reports pt was at [facility] and fell. Denies any dizziness or LOC. Denies any neck or back pain. Pt c/o left elbow pain. +PMS. Right pinky is bruised.Pt states she was walking to fast with her cane and lost her footing.

## 2023-10-01 NOTE — ED Provider Notes (Signed)
 [place] EMERGENCY DEPARTMENT AT [facility] Provider Note   CSN: [id number] Arrival date & time: 10/01/23  1738     Patient presents with: Sue Ruiz is a 78 y.o. female.   HPI     This is a 78 year old female who presents after a fall.  Patient reports that she was at [facility] when she was walking back in the door.  She states that she has had difficulty with gait over the last 6 months and uses a cane.  She states that she was shuffling and her feet got out from underneath her.  She fell.  She denies hitting her head or loss of consciousness.  She is reporting right hand pain and left elbow pain.  She not on any blood thinners.  Denies numbness or tingling of the fingers  Prior to Admission medications   Medication Sig Start Date End Date Taking? Authorizing Provider  acetaminophen  (TYLENOL ) 325 MG tablet Take 650 mg by mouth every 6 (six) hours as needed for moderate pain.    [provider]  amoxicillin-clavulanate (AUGMENTIN) 875-125 MG tablet Take 1 tablet by mouth 2 (two) times daily. 08/16/23   [provider]  Ascorbic Acid (VITAMIN C) 1000 MG tablet Take 1,000 mg by mouth daily.    [provider]  Blood Glucose Monitoring Suppl (GLUCOCOM BLOOD GLUCOSE MONITOR) DEVI 1 Units by Misc.(Non-Drug; Combo Route) route 4 times daily. 04/11/14   [provider]  calcium carbonate (TUMS - DOSED IN MG ELEMENTAL CALCIUM) 500 MG chewable tablet Chew 1 tablet by mouth daily as needed for indigestion or heartburn.    [provider]  Calcium Carbonate-Vit D-Min (CALCIUM 1200 PO) Take 1 tablet by mouth daily at 12 noon.    [provider]  cholecalciferol (VITAMIN D3) 25 MCG (1000 UNIT) tablet Take 1,000 Units by mouth daily.    [provider]  docusate sodium  (COLACE) 100 MG capsule Take 1 capsule (100 mg total) by mouth daily as needed for up to 30 doses. 10/16/22   Selma Donnice SAUNDERS, MD  docusate sodium   (COLACE) 250 MG capsule Take 1 capsule (250 mg total) by mouth daily. 02/23/18   Angelena Smalls, MD  empagliflozin (JARDIANCE) 10 MG TABS tablet Take 10 mg by mouth. 04/26/23   [provider]  esomeprazole (NEXIUM) 40 MG capsule Take 40 mg by mouth daily. 12/15/17   [provider]  glipiZIDE (GLUCOTROL) 5 MG tablet Take 5 mg by mouth daily before breakfast.    [provider]  glucose blood test strip Check blood sugar once in the morning before eating; two hours after dinner; and as needed for low blood sugar symptoms. 09/15/17   [provider]  hydrochlorothiazide  (HYDRODIURIL ) 25 MG tablet Take 25 mg by mouth daily. 12/15/17   [provider]  JANUVIA 50 MG tablet Take 50 mg by mouth daily. 08/20/23   [provider]  lisinopril (ZESTRIL) 20 MG tablet Take 20 mg by mouth daily.    [provider]  metFORMIN (GLUCOPHAGE) 1000 MG tablet Take 1,000 mg by mouth in the morning and at bedtime. 07/13/17   [provider]  MYRBETRIQ 25 MG TB24 tablet Take 25 mg by mouth daily.    [provider]  nitrofurantoin, macrocrystal-monohydrate, (MACROBID) 100 MG capsule Take 100 mg by mouth 2 (two) times daily. 04/01/23   [provider]  nystatin cream (MYCOSTATIN) Apply 1 Application topically daily as needed for dry skin.  [provider]  Omega-3 Fatty Acids (FISH OIL) 1000 MG CAPS Take 1,000 mg by mouth daily.    [provider]  OneTouch UltraSoft 2 Lancets MISC USE 1 LANCET ONCE DAILY AS  DIRECTED 05/26/22   [provider]  oxyCODONE -acetaminophen  (PERCOCET) 5-325 MG tablet Take 1 tablet by mouth every 4 (four) hours as needed for up to 15 doses for severe pain. 11/20/22   Selma Donnice SAUNDERS, MD  pravastatin  (PRAVACHOL ) 40 MG tablet Take 40 mg by mouth daily. 12/15/17   [provider]  rosuvastatin (CRESTOR) 10 MG tablet Take 1 tablet by mouth daily. 04/27/23   [provider]   triamcinolone cream (KENALOG) 0.1 % Apply 1 Application topically daily as needed (irritation).    [provider]    Allergies: Codeine, Asa [aspirin], Chlorhexidine , Insulin  glargine, and Losartan    Review of Systems  Musculoskeletal:  Positive for gait problem.       Right hand pain, left elbow pain  All other systems reviewed and are negative.   Updated Vital Signs BP (!) 145/75   Pulse 96   Temp 98.4 F (36.9 C) (Oral)   Resp 18   SpO2 100%   Physical Exam Vitals and nursing note reviewed.  Constitutional:      Appearance: She is well-developed. She is not ill-appearing.  HENT:     Head: Normocephalic and atraumatic.  Eyes:     Pupils: Pupils are equal, round, and reactive to light.  Cardiovascular:     Rate and Rhythm: Normal rate and regular rhythm.     Heart sounds: Normal heart sounds.  Pulmonary:     Effort: Pulmonary effort is normal. No respiratory distress.     Breath sounds: No wheezing.  Abdominal:     Palpations: Abdomen is soft.  Musculoskeletal:     Cervical back: Neck supple.     Comments: Bruising noted to the 3rd and 4th digits of the right hand, flexion and extension intact, slight swelling noted, 2+ radial pulse Tenderness to palpation of the radial head on the left with arm in a flexed position, limited range of motion secondary to pain, neurovascular intact distally  Skin:    General: Skin is warm and dry.  Neurological:     Mental Status: She is alert and oriented to person, place, and time.     (all labs ordered are listed, but only abnormal results are displayed) Labs Reviewed  CBG MONITORING, ED - Abnormal; Notable for the following components:      Result Value   Glucose-Capillary 174 (*)    All other components within normal limits    EKG: None  Radiology: DG Hand Complete Right Result Date: 10/01/2023 CLINICAL DATA:  Status post fall with subsequent fifth right finger injury. EXAM: RIGHT HAND - COMPLETE 3+ VIEW  COMPARISON:  None Available. FINDINGS: There is no evidence of an acute fracture or dislocation. A chronic appearing deformity is seen involving the medial aspect of the distal right radius. Mild to moderate severity degenerative changes are seen involving multiple interphalangeal joints. Soft tissues are unremarkable. IMPRESSION: 1. Degenerative changes without evidence of an acute fracture or dislocation. Electronically Signed   By: Suzen Dials M.D.   On: 10/01/2023 19:23   DG Elbow 2 Views Left Result Date: 10/01/2023 CLINICAL DATA:  Status post fall. EXAM: LEFT ELBOW - 2 VIEW COMPARISON:  None Available. FINDINGS: A fracture deformity of indeterminate age is seen involving the lateral aspect of the head of  the left radius. There is no evidence of dislocation. Mild to moderate severity degenerative changes are seen throughout the left elbow. Mild posterior soft tissue swelling is noted. IMPRESSION: Fracture of indeterminate age involving the head of the left radius. Correlation with physical examination is recommended to determine the presence of point tenderness. Electronically Signed   By: Suzen Dials M.D.   On: 10/01/2023 19:21     Procedures   Medications Ordered in the ED - No data to display                                  Medical Decision Making Amount and/or Complexity of Data Reviewed Radiology: ordered.   This patient presents to the ED for concern of fall, this involves an extensive number of treatment options, and is a complaint that carries with it a high risk of complications and morbidity.  I considered the following differential and admission for this acute, potentially life threatening condition.  The differential diagnosis includes contusion, fracture, dislocation  MDM:    This is a 78 year old female who presents after a fall.  Reports mechanical fall.  She is overall nontoxic.  Denies hitting her head or loss of consciousness.  Mostly having right hand pain  and left arm pain.  X-rays of the right hand do not show any obvious fracture.  Likely contusion.  Left elbow films are concerning for possible radial head fracture.  This does clinically correlate.  Patient is neurologically intact.  Will place in a posterior splint and provide orthopedic follow-up.  (Labs, imaging, consults)  Labs: I Ordered, and personally interpreted labs.  The pertinent results include: None  Imaging Studies ordered: I ordered imaging studies including x-ray elbow and hand I independently visualized and interpreted imaging. I agree with the radiologist interpretation  Additional history obtained from chart review.  External records from outside source obtained and reviewed including prior evaluations  Cardiac Monitoring: The patient was not maintained on a cardiac monitor.  If on the cardiac monitor, I personally viewed and interpreted the cardiac monitored which showed an underlying rhythm of: N/A  Reevaluation: After the interventions noted above, I reevaluated the patient and found that they have :stayed the same  Social Determinants of Health:  lives independently  Disposition: Discharge  Co morbidities that complicate the patient evaluation  Past Medical History:  Diagnosis Date   Anemia 2020   hx of iron infusions   Barrett's esophagus    Chest pain 12/19/2021   Per patient, she was seen in the ED and told her pain was due to acid reflux.   Diabetes mellitus without complication [facility])    Patient follows w/ PCP, Dr. Isaiah Cones .   Enlarged liver    Family history of adverse reaction to anesthesia    Patient states that her oldest daughter is difficult to wake up from anesthesia.   Fibromyalgia    Follows w/ PCP, Dr. Isaiah Cones.   GERD (gastroesophageal reflux disease)    Takes Nexium daily.   High cholesterol    Hypertension    Patient follows w/ PCP, Dr. Isaiah Cones.   IBS (irritable bowel syndrome)    Loose, teeth 2024   Patient  states that she has several loose teeth on the front bottom.   Vertigo    Wears glasses      Medicines No orders of the defined types were placed in this encounter.  I have reviewed the patients home medicines and have made adjustments as needed  Problem List / ED Course: Problem List Items Addressed This Visit   None Visit Diagnoses       Closed nondisplaced fracture of head of left radius, initial encounter    -  Primary                Final diagnoses:  Closed nondisplaced fracture of head of left radius, initial encounter    ED Discharge Orders     None          Bari Charmaine FALCON, MD 10/02/23 [phone]

## 2023-10-02 ENCOUNTER — Telehealth: Payer: Self-pay

## 2023-10-02 NOTE — Telephone Encounter (Signed)
 Patients daughter Glenys called from [place]  wanting information as she is worried about her mother. Called her mother who was seen here last night and left message to call this RNCm and give permission to give Glenys information as she is not on the contact list.

## 2023-10-02 NOTE — Discharge Instructions (Signed)
 You were seen today after a fall.  You do have a fracture at the radial head which is at the elbow.  Keep splint in place.  Follow-up with orthopedics.

## 2023-10-02 NOTE — Progress Notes (Signed)
 Orthopedic Tech Progress Note Patient Details:  Sue Ruiz [birth year] [id number]  Ortho Devices Type of Ortho Device: Ace wrap, Cotton web roll, Sling immobilizer, Long arm splint Ortho Device/Splint Location: LUE Ortho Device/Splint Interventions: Application, Adjustment, Ordered   Post Interventions Patient Tolerated: Well Instructions Provided: Care of device   Sue Ruiz L Sue Ruiz 10/02/2023, 12:57 AM

## 2023-11-29 ENCOUNTER — Ambulatory Visit (INDEPENDENT_AMBULATORY_CARE_PROVIDER_SITE_OTHER): Admitting: Podiatry

## 2023-11-29 DIAGNOSIS — M79674 Pain in right toe(s): Secondary | ICD-10-CM

## 2023-11-29 DIAGNOSIS — B351 Tinea unguium: Secondary | ICD-10-CM | POA: Diagnosis not present

## 2023-11-29 DIAGNOSIS — M79675 Pain in left toe(s): Secondary | ICD-10-CM | POA: Diagnosis not present

## 2023-11-29 NOTE — Progress Notes (Unsigned)
 Subjective:  Patient ID: Sue Ruiz, female    DOB: October 09, 1945,  MRN: [id number]  Sue Ruiz presents to clinic today for:  Chief Complaint  Patient presents with   Diabetes    [facility] NIDDM A1C 7. Toenail trim.   Patient notes nails are thick, discolored, elongated and painful in shoegear when trying to ambulate.    PCP is Sue Fine, MD.  Past Medical History:  Diagnosis Date   Anemia 2020   hx of iron infusions   Barrett's esophagus    Chest pain 12/19/2021   Per patient, she was seen in the ED and told her pain was due to acid reflux.   Diabetes mellitus without complication [facility])    Patient follows w/ PCP, Dr. Isaiah Ruiz .   Enlarged liver    Family history of adverse reaction to anesthesia    Patient states that her oldest daughter is difficult to wake up from anesthesia.   Fibromyalgia    Follows w/ PCP, Dr. Isaiah Ruiz.   GERD (gastroesophageal reflux disease)    Takes Nexium daily.   High cholesterol    Hypertension    Patient follows w/ PCP, Dr. Isaiah Ruiz.   IBS (irritable bowel syndrome)    Loose, teeth 2024   Patient states that she has several loose teeth on the front bottom.   Vertigo    Wears glasses    Past Surgical History:  Procedure Laterality Date   COLONOSCOPY  11/14/2018   CYSTOSCOPY N/A 10/16/2022   Procedure: CYSTOSCOPY;  Surgeon: Sue Donnice SAUNDERS, MD;  [facility];  Service: Urology;  Laterality: N/A;   ESOPHAGOGASTRODUODENOSCOPY  10/15/2021   TOTAL VAGINAL HYSTERECTOMY  2013   with BSO - confirmed by op report and pathology. 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   Chlorhexidine  Hives and Rash    Preop CHG gave her a rash    Insulin  Glargine Hives   Losartan Swelling    Swelling in fingers    Review of Systems: Negative except as noted in the HPI.  Objective:  Sue Ruiz is a pleasant 78 y.o. female in NAD. AAO x 3.  Vascular Examination: Capillary refill time is 3-5 seconds to toes bilateral. Palpable pedal pulses b/l LE. Digital hair present b/l.  Skin temperature gradient WNL b/l. No varicosities b/l. No cyanosis noted b/l.   Dermatological Examination: Pedal skin with normal turgor, texture and tone b/l. No open wounds. No interdigital macerations b/l. Toenails x10 are 3mm thick, discolored, dystrophic with subungual debris. There is pain with compression of the nail plates.  They are elongated x10  Assessment/Plan: 1. Pain due to onychomycosis of toenails of both feet    The mycotic toenails were sharply debrided x10 with sterile nail nippers and  a power debriding burr to decrease bulk/thickness and length.    Return in about 3 months (around 02/28/2024) for [facility].   Sue Ruiz, DPM, FACFAS [facility]     [address] [place], [place] [address]                Office [phone]  Fax [phone]

## 2024-02-28 ENCOUNTER — Encounter: Admitting: Podiatry

## 2024-02-28 NOTE — Progress Notes (Signed)
Patient did not show for scheduled appointment today.
